# Patient Record
Sex: Female | Born: 1985 | Hispanic: Yes | Marital: Married | Smoking: Never smoker
Health system: Southern US, Community
[De-identification: ages and names within clinical notes are randomized; demographics above are authoritative.]

## PROBLEM LIST (undated history)

## (undated) DIAGNOSIS — Z789 Other specified health status: Secondary | ICD-10-CM

## (undated) HISTORY — PX: NO PAST SURGERIES: SHX2092

---
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
## 2006-01-13 ENCOUNTER — Ambulatory Visit (HOSPITAL_COMMUNITY): Admission: RE | Admit: 2006-01-13 | Discharge: 2006-01-13 | Payer: Self-pay | Admitting: Obstetrics & Gynecology

## 2006-06-17 ENCOUNTER — Inpatient Hospital Stay (HOSPITAL_COMMUNITY): Admission: AD | Admit: 2006-06-17 | Discharge: 2006-06-18 | Payer: Self-pay | Admitting: Gynecology

## 2006-06-17 ENCOUNTER — Ambulatory Visit: Payer: Self-pay | Admitting: Obstetrics & Gynecology

## 2007-01-17 IMAGING — US US OB COMP +14 WK
1 series · 13 of 28 positions shown · non-contrast
Comparison: none

CLINICAL DATA: 18 week 6 day gestational age by LMP.  Obesity.  Evaluate dating and anatomy.

[Series 1: us ob comp +14 wk · 0.29mm/px · 13 of 93 slices shown]
[im 4/93]
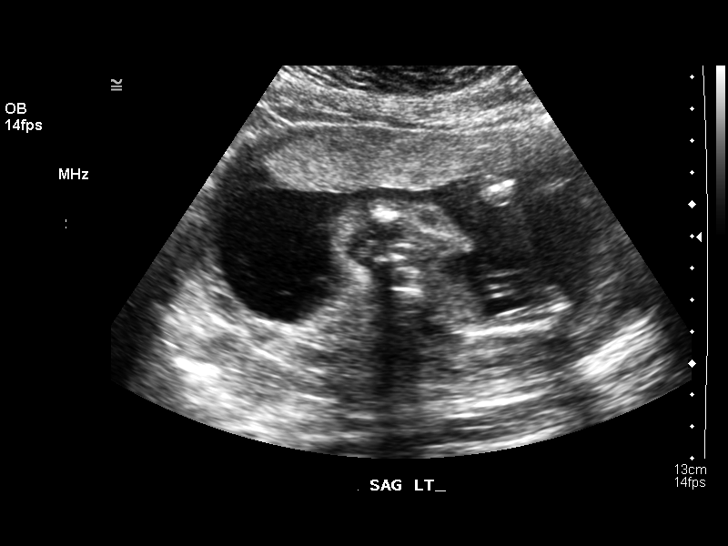
[im 11/93]
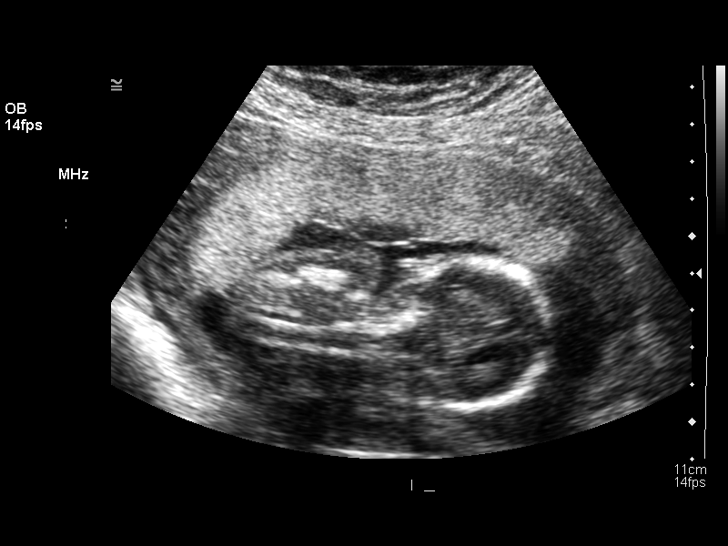
[im 18/93]
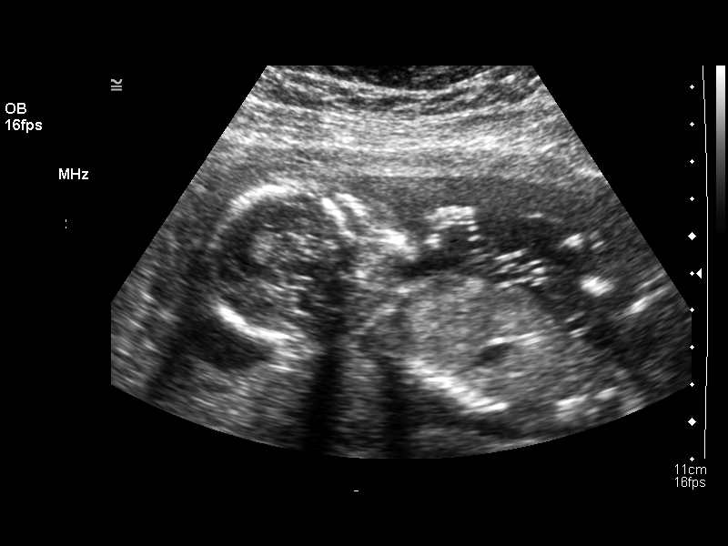
[im 24/93]
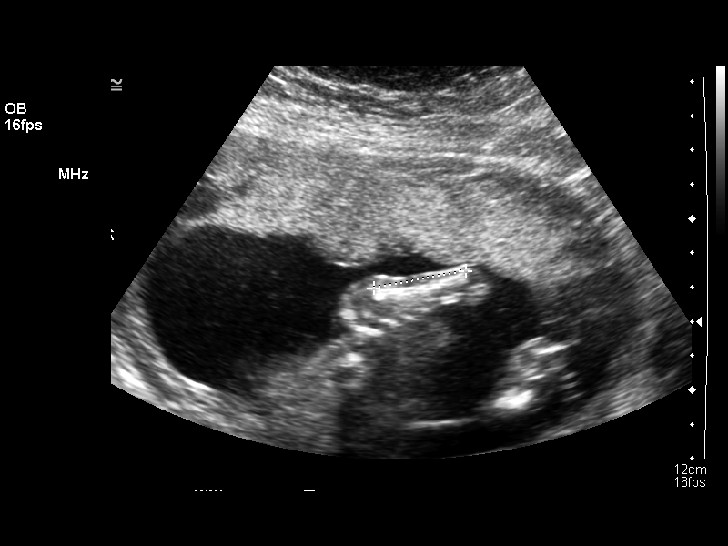
[im 31/93]
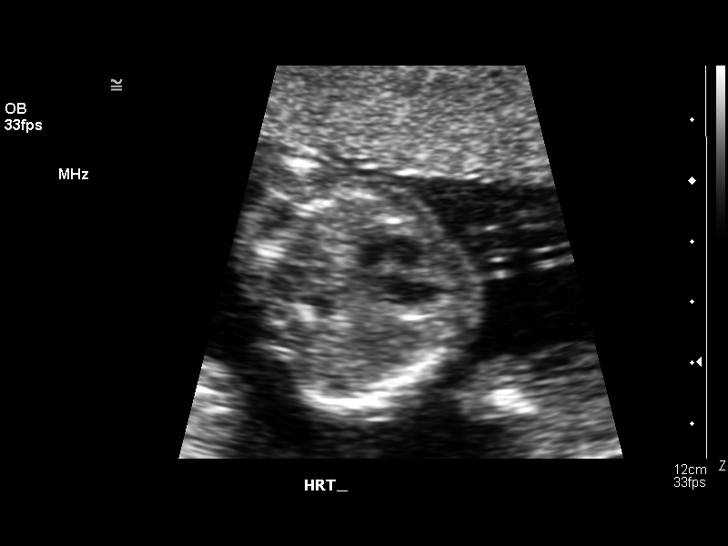
[im 38/93]
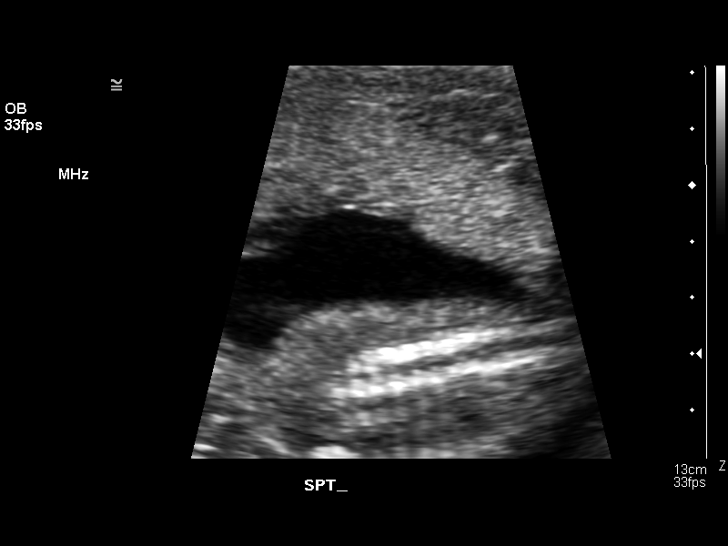
[im 48/93]
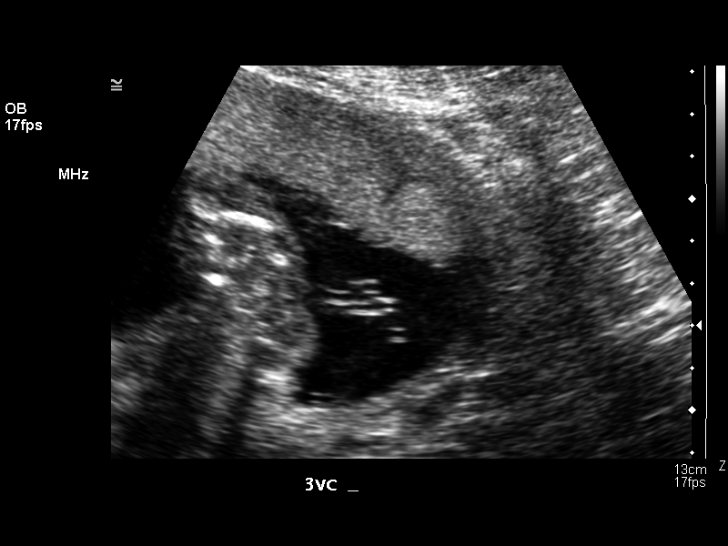
[im 55/93]
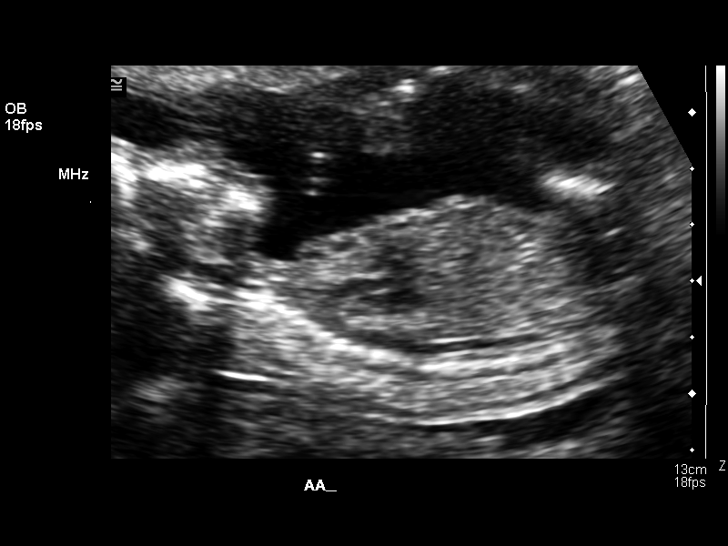
[im 62/93]
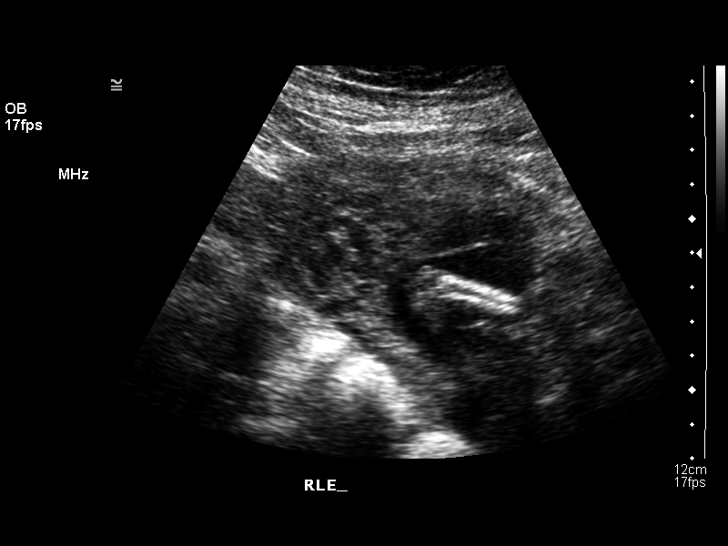
[im 69/93]
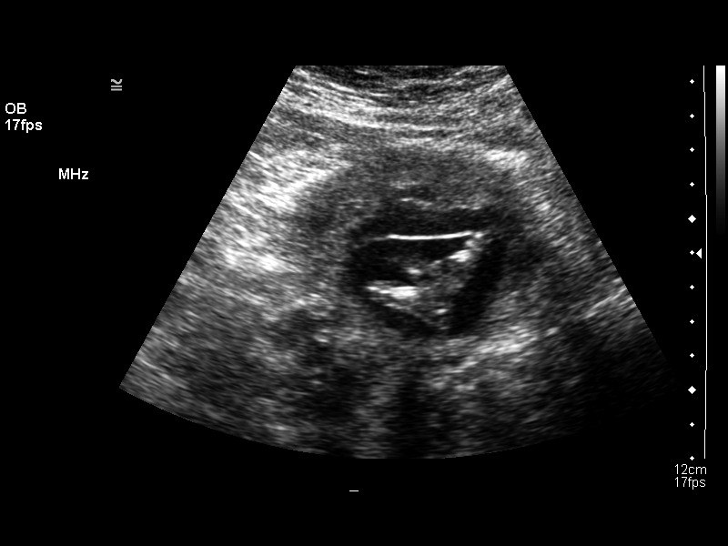
[im 75/93]
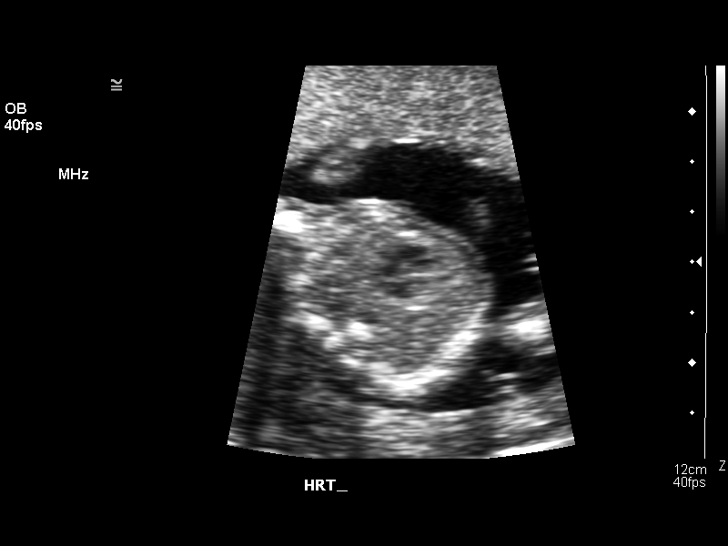
[im 82/93]
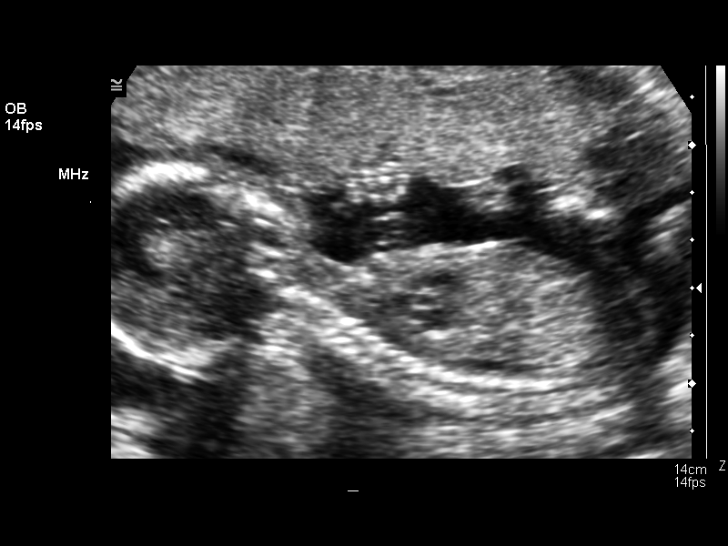
[im 89/93]
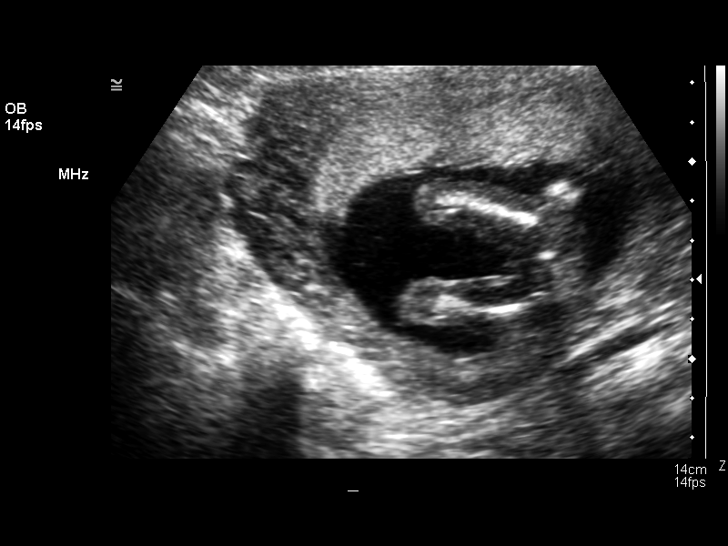

[13 of 28 positions shown; findings below may reference images not displayed]

OBSTETRICAL ULTRASOUND:
 Number of Fetuses: 1
 Heart Rate: 150 bpm
 Movement:  Yes
 Breathing:  Yes  
 Presentation:  Breech
 Placental Location:  Anterior
 Grade:  I
 Previa:  No
 Amniotic Fluid (Subjective):  Normal
 Amniotic Fluid (Objective):   4.2 cm vertical pocket 

 FETAL BIOMETRY
 BPD:   4.0 cm  18 w 2 d
 HC:   15.1 cm  18 w 1 d
 AC:  12.8 cm  18 w 3 d
 FL:  2.7 cm   18 w 3 d
 HL:    2.6 cm  18 w 4 d  
 MEAN GA:  18 w 3 d   US EDC:  06/13/06

 FETAL ANATOMY
 Lateral Ventricles:  Visualized  
 Thalami/CSP:  Visualized  
 Posterior Fossa:  Visualized   
 Nuchal Region:  Visualized 
 Spine:  Visualized  
 4 Chamber Heart on Left:  Visualized  
 Stomach on Left:  Visualized  
 3 Vessel Cord:  Visualized 
 Cord Insertion site:  Visualized 
 Kidneys:  Visualized   
 Bladder:  Visualized   
 Extremities:  Visualized  

 ADDITIONAL ANATOMY VISUALIZED: LVOT, RVOT, upper lip, orbits, profile, diaphragm, heel, 5th digit, ductal arch, and aortic arch.

 MATERNAL UTERINE AND ADNEXAL FINDINGS
 Cervix:   4.0 cm transabdominal.
 Right ovary is unremarkable.  Left ovary not visualized; however, no adnexal mass identified.
IMPRESSION: 1.  Single living intrauterine fetus with mean gestational age of 18 weeks 3 days and sonographic EDC of 06/13/06.  This is concordant with LMP.
 2.  No evidence of fetal anatomic abnormality.

## 2010-06-13 ENCOUNTER — Ambulatory Visit (HOSPITAL_COMMUNITY)
Admission: RE | Admit: 2010-06-13 | Discharge: 2010-06-13 | Payer: Self-pay | Source: Home / Self Care | Attending: Family Medicine | Admitting: Family Medicine

## 2010-08-30 ENCOUNTER — Inpatient Hospital Stay (HOSPITAL_COMMUNITY): Admission: AD | Admit: 2010-08-30 | Payer: Self-pay | Source: Home / Self Care | Admitting: Obstetrics & Gynecology

## 2010-09-01 ENCOUNTER — Other Ambulatory Visit: Payer: Self-pay | Admitting: Family Medicine

## 2010-09-01 DIAGNOSIS — O480 Post-term pregnancy: Secondary | ICD-10-CM

## 2010-09-02 ENCOUNTER — Inpatient Hospital Stay (HOSPITAL_COMMUNITY): Admission: RE | Admit: 2010-09-02 | Payer: Self-pay | Source: Ambulatory Visit

## 2010-09-02 ENCOUNTER — Inpatient Hospital Stay (HOSPITAL_COMMUNITY)
Admission: AD | Admit: 2010-09-02 | Discharge: 2010-09-04 | DRG: 775 | Disposition: A | Discharge status: Home / Self Care | Payer: Medicaid Other | Source: Ambulatory Visit | Attending: Obstetrics and Gynecology | Admitting: Obstetrics and Gynecology

## 2010-09-02 ENCOUNTER — Ambulatory Visit (HOSPITAL_COMMUNITY)
Admission: RE | Admit: 2010-09-02 | Discharge: 2010-09-02 | Payer: Medicaid Other | Source: Ambulatory Visit | Attending: Family Medicine | Admitting: Family Medicine

## 2010-09-02 LAB — CBC
HCT: 37.7 % (ref 36.0–46.0)
Hemoglobin: 12.2 g/dL (ref 12.0–15.0)
MCH: 26 pg (ref 26.0–34.0)
MCHC: 32.4 g/dL (ref 30.0–36.0)
MCV: 80.2 fL (ref 78.0–100.0)
Platelets: 174 10*3/uL (ref 150–400)
RBC: 4.7 MIL/uL (ref 3.87–5.11)
RDW: 14.8 % (ref 11.5–15.5)
WBC: 9.8 10*3/uL (ref 4.0–10.5)

## 2010-09-02 LAB — RPR: RPR Ser Ql: NONREACTIVE

## 2010-09-09 ENCOUNTER — Other Ambulatory Visit: Payer: Self-pay

## 2010-10-10 NOTE — Consult Note (Signed)
   Amy Velazquez, Amy Velazquez                          ACCOUNT NO.:  [id number]   MEDICAL RECORD NO.:  [id number]                   PATIENT TYPE:   LOCATION:                                       FACILITY:   PHYSICIAN:  Dr. Kristen Loader                    DATE OF BIRTH:   DATE OF CONSULTATION:  DATE OF DISCHARGE:                                   CONSULTATION   SUMMARY:  After further discussion with Dr. Gavin Potters, we will cancel the  patient's discharge at this time.     Obstetrics Resident                       Dr. Kristen Loader    OR/MEDQ  D:  08/22/2002  T:  08/22/2002  Job:  [id number]   cc:   Conni Elliot, M.D.  [address]  [place]  [place] [address]  Fax: [phone]

## 2010-10-10 NOTE — Discharge Summary (Signed)
   NAMEDEVANI, Amy Velazquez                          ACCOUNT NO.:  [id number]   MEDICAL RECORD NO.:  [id number]                   PATIENT TYPE:   LOCATION:                                       FACILITY:   PHYSICIAN:  Dr. Kristen Loader                    DATE OF BIRTH:   DATE OF ADMISSION:  DATE OF DISCHARGE:                                 DISCHARGE SUMMARY   DISCHARGE DIAGNOSES:  1. Group B strep bacteruria.  2. Threatened preterm labor.  3. Intrauterine pregnancy at [redacted] weeks gestation.   CONSULTATIONS:  None.   LABORATORY DATA:  CBC revealed WBC count 13.2, hemoglobin and hematocrit  10.4 and 30.4, RPR non-reactive. Please see admission history and physical  for further laboratories.   HISTORY OF PRESENT ILLNESS:  The patient is an 25 year old Gravida I, Para  0, Hispanic female who presented at 22 and [redacted] weeks gestation by last  menstrual period, confirmed on ultrasound, who was sent over from the clinic  with GBS bacteruria and cervical change. The patient was admitted overnight  and placed on Unasyn 3 grams IV every six hours, Betamethasone 12.5 mg every  24 hours times two, and placed on continuous monitoring. The patient had  very mild uterine irritability but no sustained contractions. Fetal heart  rate remained in the 135 to 160 range and was reactive with long term  variability with no decelerations throughout her hospital stay. Cervical  recheck on discharge finds the patient to be fingertip, 60% effaced, with  high station. She had previous been noted at 1 cm, 40% effaced and posterior  at clinic on the 29th.   PLANS FOR DISCHARGE:  The patient is to be discharged to home today.   ACTIVITY:  Nothing per vagina until further notice.   DIET:  Regular.   DISCHARGE MEDICATIONS:  Prenatal vitamins.   FOLLOW UP:  The patient is to be discharged to follow-up at [facility] on Wednesday, August 30, 2002 at 8:30 a.m.      Obstetrics Resident                        Dr. Kristen Loader    OR/MEDQ  D:  08/22/2002  T:  08/22/2002  Job:  [id number]

## 2011-05-14 ENCOUNTER — Encounter: Payer: Self-pay | Admitting: Emergency Medicine

## 2011-05-14 ENCOUNTER — Emergency Department (HOSPITAL_COMMUNITY)
Admission: EM | Admit: 2011-05-14 | Discharge: 2011-05-14 | Disposition: A | Discharge status: Home / Self Care | Payer: Medicaid Other | Source: Home / Self Care | Attending: Emergency Medicine | Admitting: Emergency Medicine

## 2011-05-14 DIAGNOSIS — K2970 Gastritis, unspecified, without bleeding: Secondary | ICD-10-CM

## 2011-05-14 DIAGNOSIS — K2900 Acute gastritis without bleeding: Secondary | ICD-10-CM

## 2011-05-14 LAB — POCT URINALYSIS DIP (DEVICE)
Leukocytes, UA: NEGATIVE
Nitrite: NEGATIVE
Protein, ur: NEGATIVE mg/dL
Urobilinogen, UA: 1 mg/dL (ref 0.0–1.0)
pH: 5.5 (ref 5.0–8.0)

## 2011-05-14 LAB — POCT H PYLORI SCREEN: H. PYLORI SCREEN, POC: NEGATIVE

## 2011-05-14 LAB — POCT PREGNANCY, URINE: Preg Test, Ur: NEGATIVE

## 2011-05-14 MED ORDER — SUCRALFATE 1 GM/10ML PO SUSP: 1.0000 g | Freq: Four times a day (QID) | ORAL | Status: AC

## 2011-05-14 MED ORDER — PANTOPRAZOLE SODIUM 40 MG PO TBEC: 40.0000 mg | DELAYED_RELEASE_TABLET | Freq: Every day | ORAL | Status: DC

## 2011-05-14 MED ORDER — FAMOTIDINE 20 MG PO TABS: 20.0000 mg | ORAL_TABLET | Freq: Every day | ORAL | Status: DC

## 2011-05-14 MED ORDER — SUCRALFATE 1 GM/10ML PO SUSP: 1.0000 g | Freq: Four times a day (QID) | ORAL | Status: DC

## 2011-05-14 NOTE — ED Provider Notes (Signed)
History     CSN: [id number]  Arrival date & time 05/14/11  7846   First MD Initiated Contact with Patient 05/14/11 1831      Chief Complaint  Patient presents with  . Abdominal Pain    HPI Comments: burning intermittent LUQ pain starting last night. Started in RUQ last night and is now in LUQ. Lasts approx 20 min and resolves. Has nausea, but no anorexia. Is afraid to eat because afraid it will make it worse. Reports abd bloating but no distension. Vomited stomach contents 1x this am but no change in pain. No fevers, CP, cough, wheeze, SOB, urinary c/o, vaginal c/o. BM this am was WNL.  Pain worse with palpation. No alleviating factors. Similar sx before 2 months ago and was told she had a "stomach infection" which was treated with amoxicillin and an unknown injection.   Patient is a 25 y.o. female presenting with abdominal pain. The history is provided by the patient. The history is limited by a language barrier. A language interpreter was used.  Abdominal Pain The primary symptoms of the illness include abdominal pain, nausea and vomiting. The primary symptoms of the illness do not include fever, shortness of breath, diarrhea, dysuria, vaginal discharge or vaginal bleeding.  Symptoms associated with the illness do not include constipation, urgency, hematuria, frequency or back pain.    History reviewed. No pertinent past medical history.  History reviewed. No pertinent past surgical history.  History reviewed. No pertinent family history.  History  Substance Use Topics  . Smoking status: Never Smoker   . Smokeless tobacco: Not on file  . Alcohol Use: No    OB History    Grav Para Term Preterm Abortions TAB SAB Ect Mult Living                  Review of Systems  Constitutional: Negative for fever.  Respiratory: Negative for cough, chest tightness and shortness of breath.   Cardiovascular: Negative for chest pain.  Gastrointestinal: Positive for nausea, vomiting and  abdominal pain. Negative for diarrhea, constipation, blood in stool, abdominal distention and rectal pain.  Genitourinary: Negative for dysuria, urgency, frequency, hematuria, flank pain, vaginal bleeding, vaginal discharge and pelvic pain.  Musculoskeletal: Negative for back pain.    Allergies  Review of patient's allergies indicates no known allergies.  Home Medications   Current Outpatient Rx  Name Route Sig Dispense Refill  . FAMOTIDINE 20 MG PO TABS Oral Take 1 tablet (20 mg total) by mouth daily. 20 tablet 0  . PANTOPRAZOLE SODIUM 40 MG PO TBEC Oral Take 1 tablet (40 mg total) by mouth daily. 20 tablet 0  . SUCRALFATE 1 GM/10ML PO SUSP Oral Take 10 mLs (1 g total) by mouth 4 (four) times daily. 10 mL before meals and before bedtime 240 mL 0    BP 121/80  Pulse 77  Temp(Src) 97.7 F (36.5 C) (Oral)  Resp 18  SpO2 97%  LMP 05/11/2011  Physical Exam  Nursing note and vitals reviewed. Constitutional: She is oriented to person, place, and time. She appears well-developed and well-nourished.  HENT:  Head: Normocephalic and atraumatic.  Eyes: Conjunctivae and EOM are normal. Pupils are equal, round, and reactive to light.  Neck: Normal range of motion.  Cardiovascular: Normal rate, regular rhythm, normal heart sounds and intact distal pulses.   No murmur heard. Pulmonary/Chest: Effort normal and breath sounds normal.  Abdominal: Soft. Bowel sounds are normal. She exhibits no distension. There is no hepatosplenomegaly. There  is tenderness in the left upper quadrant. There is no rigidity, no rebound, no guarding, no CVA tenderness, no tenderness at McBurney's point and negative Murphy's sign.       Mild LUQ tenderness.   Musculoskeletal: Normal range of motion. She exhibits no edema and no tenderness.  Neurological: She is alert and oriented to person, place, and time.  Skin: Skin is warm and dry.  Psychiatric: She has a normal mood and affect. Her behavior is normal. Judgment  and thought content normal.    ED Course  Procedures (including critical care time)  Labs Reviewed  POCT URINALYSIS DIP (DEVICE) - Abnormal; Notable for the following:    Bilirubin Urine MODERATE (*)    All other components within normal limits  POCT H PYLORI SCREEN  POCT PREGNANCY, URINE  POCT URINALYSIS DIPSTICK  POCT PREGNANCY, URINE   No results found.   1. Gastritis     Results for orders placed during the hospital encounter of 05/14/11  POCT H PYLORI SCREEN      Component Value Range   H. PYLORI SCREEN, POC NEGATIVE  NEGATIVE   POCT URINALYSIS DIP (DEVICE)      Component Value Range   Glucose, UA NEGATIVE  NEGATIVE (mg/dL)   Bilirubin Urine MODERATE (*) NEGATIVE    Ketones, ur NEGATIVE  NEGATIVE (mg/dL)   Specific Gravity, Urine 1.010  1.005 - 1.030    Hgb urine dipstick NEGATIVE  NEGATIVE    pH 5.5  5.0 - 8.0    Protein, ur NEGATIVE  NEGATIVE (mg/dL)   Urobilinogen, UA 1.0  0.0 - 1.0 (mg/dL)   Nitrite NEGATIVE  NEGATIVE    Leukocytes, UA NEGATIVE  NEGATIVE   POCT PREGNANCY, URINE      Component Value Range   Preg Test, Ur NEGATIVE       MDM  Pt abd exam is benign, no peritoneal signs. No evidence of surgical abd. Doubt SBO, mesenteric ischemia, appendicitis, hepatitis, cholecystitis, pancreatitis, or perforated viscus. No evidence to support or suggest GYN pathology such as ovarian torsion or infection. No RUQ tenderness suggesting cholelithiasis. Hx suggestive of h pylori but rapid test neg. Will try H2/ppi/sucralfate and if no better in several days will have her return and start tx for h. Pylori at that time.    Luiz Blare, MD 05/15/11 Rich Fuchs

## 2011-05-14 NOTE — ED Notes (Signed)
Pt c/o upper abdominal pain starting last night. She says it is not cramping, it appears to be right below her breast and midline. No urinary frequency or urgency

## 2014-05-25 NOTE — L&D Delivery Note (Signed)
Patient is 29 y.o. [id number] 4261w3d admitted in active labor and progressed quickly to complete with the strong urge to push. She received 1 dose of IV fentanyl ~30 minutes prior to delivery.    Delivery Note At 9:20 PM a viable female was delivered via Vaginal, Spontaneous Delivery (Presentation: Left Occiput Anterior).  APGAR: 9, 10; weight-pending.   Placenta status:Spontaneous, Intact.  Cord: 3 vessels. No complications None.  Cord pH: not collected  Anesthesia: None  Episiotomy: None Lacerations: None Suture Repair: n/a Est. Blood Loss (mL): 256  Mom to postpartum.  Baby to Couplet care / Skin to Skin.  Isa RankinKimberly Niles [facility] 03/30/2015, 9:43 PM

## 2014-10-25 ENCOUNTER — Other Ambulatory Visit (HOSPITAL_COMMUNITY): Payer: Self-pay | Admitting: Physician Assistant

## 2014-10-25 DIAGNOSIS — IMO0002 Reserved for concepts with insufficient information to code with codable children: Secondary | ICD-10-CM

## 2014-10-25 DIAGNOSIS — Z0489 Encounter for examination and observation for other specified reasons: Secondary | ICD-10-CM

## 2014-10-25 LAB — OB RESULTS CONSOLE HEPATITIS B SURFACE ANTIGEN: Hepatitis B Surface Ag: NEGATIVE

## 2014-10-25 LAB — OB RESULTS CONSOLE RUBELLA ANTIBODY, IGM: Rubella: IMMUNE

## 2014-10-25 LAB — OB RESULTS CONSOLE RPR: RPR: NONREACTIVE

## 2014-10-25 LAB — OB RESULTS CONSOLE GC/CHLAMYDIA
Chlamydia: NEGATIVE
GC PROBE AMP, GENITAL: NEGATIVE

## 2014-10-25 LAB — OB RESULTS CONSOLE ANTIBODY SCREEN: ANTIBODY SCREEN: NEGATIVE

## 2014-10-25 LAB — OB RESULTS CONSOLE ABO/RH: RH Type: POSITIVE

## 2014-10-25 LAB — OB RESULTS CONSOLE HIV ANTIBODY (ROUTINE TESTING): HIV: NONREACTIVE

## 2014-11-19 ENCOUNTER — Ambulatory Visit (HOSPITAL_COMMUNITY)
Admission: RE | Admit: 2014-11-19 | Discharge: 2014-11-19 | Disposition: A | Discharge status: Home / Self Care | Payer: Medicaid Other | Source: Ambulatory Visit | Attending: Physician Assistant | Admitting: Physician Assistant

## 2014-11-19 DIAGNOSIS — Z3A20 20 weeks gestation of pregnancy: Secondary | ICD-10-CM | POA: Insufficient documentation

## 2014-11-19 DIAGNOSIS — Z0489 Encounter for examination and observation for other specified reasons: Secondary | ICD-10-CM

## 2014-11-19 DIAGNOSIS — Z36 Encounter for antenatal screening of mother: Secondary | ICD-10-CM | POA: Insufficient documentation

## 2014-11-19 DIAGNOSIS — Z3689 Encounter for other specified antenatal screening: Secondary | ICD-10-CM | POA: Insufficient documentation

## 2014-11-19 DIAGNOSIS — IMO0002 Reserved for concepts with insufficient information to code with codable children: Secondary | ICD-10-CM

## 2014-12-10 ENCOUNTER — Other Ambulatory Visit (HOSPITAL_COMMUNITY): Payer: Self-pay | Admitting: Physician Assistant

## 2014-12-10 DIAGNOSIS — Z3A24 24 weeks gestation of pregnancy: Secondary | ICD-10-CM

## 2014-12-10 DIAGNOSIS — Z0489 Encounter for examination and observation for other specified reasons: Secondary | ICD-10-CM

## 2014-12-10 DIAGNOSIS — IMO0002 Reserved for concepts with insufficient information to code with codable children: Secondary | ICD-10-CM

## 2014-12-13 ENCOUNTER — Ambulatory Visit (HOSPITAL_COMMUNITY)
Admission: RE | Admit: 2014-12-13 | Discharge: 2014-12-13 | Disposition: A | Discharge status: Home / Self Care | Payer: Medicaid Other | Source: Ambulatory Visit | Attending: Physician Assistant | Admitting: Physician Assistant

## 2014-12-13 DIAGNOSIS — Z36 Encounter for antenatal screening of mother: Secondary | ICD-10-CM | POA: Diagnosis present

## 2014-12-13 DIAGNOSIS — Z3A24 24 weeks gestation of pregnancy: Secondary | ICD-10-CM | POA: Diagnosis not present

## 2014-12-13 DIAGNOSIS — Z0489 Encounter for examination and observation for other specified reasons: Secondary | ICD-10-CM | POA: Insufficient documentation

## 2014-12-13 DIAGNOSIS — IMO0002 Reserved for concepts with insufficient information to code with codable children: Secondary | ICD-10-CM | POA: Insufficient documentation

## 2015-03-18 LAB — OB RESULTS CONSOLE GBS: STREP GROUP B AG: POSITIVE

## 2015-03-30 ENCOUNTER — Encounter (HOSPITAL_COMMUNITY): Payer: Self-pay | Admitting: *Deleted

## 2015-03-30 ENCOUNTER — Inpatient Hospital Stay (HOSPITAL_COMMUNITY)
Admission: AD | Admit: 2015-03-30 | Discharge: 2015-04-01 | DRG: 775 | Disposition: A | Discharge status: Home / Self Care | Payer: Medicaid Other | Source: Ambulatory Visit | Attending: Obstetrics and Gynecology | Admitting: Obstetrics and Gynecology

## 2015-03-30 DIAGNOSIS — IMO0001 Reserved for inherently not codable concepts without codable children: Secondary | ICD-10-CM

## 2015-03-30 DIAGNOSIS — O99824 Streptococcus B carrier state complicating childbirth: Principal | ICD-10-CM | POA: Diagnosis present

## 2015-03-30 DIAGNOSIS — Z3A38 38 weeks gestation of pregnancy: Secondary | ICD-10-CM

## 2015-03-30 HISTORY — DX: Other specified health status: Z78.9

## 2015-03-30 LAB — CBC
HCT: 35.7 % — ABNORMAL LOW (ref 36.0–46.0)
HEMOGLOBIN: 11.7 g/dL — AB (ref 12.0–15.0)
MCH: 25.7 pg — AB (ref 26.0–34.0)
MCHC: 32.8 g/dL (ref 30.0–36.0)
MCV: 78.5 fL (ref 78.0–100.0)
PLATELETS: 196 10*3/uL (ref 150–400)
RBC: 4.55 MIL/uL (ref 3.87–5.11)
RDW: 15.1 % (ref 11.5–15.5)
WBC: 12.1 10*3/uL — AB (ref 4.0–10.5)

## 2015-03-30 LAB — TYPE AND SCREEN
ABO/RH(D): AB POS
Antibody Screen: NEGATIVE

## 2015-03-30 MED ORDER — OXYCODONE-ACETAMINOPHEN 5-325 MG PO TABS: 1.0000 | ORAL_TABLET | ORAL | Status: DC | PRN

## 2015-03-30 MED ORDER — CITRIC ACID-SODIUM CITRATE 334-500 MG/5ML PO SOLN: 30.0000 mL | ORAL | Status: DC | PRN

## 2015-03-30 MED ORDER — DIPHENHYDRAMINE HCL 25 MG PO CAPS: 25.0000 mg | ORAL_CAPSULE | Freq: Four times a day (QID) | ORAL | Status: DC | PRN

## 2015-03-30 MED ORDER — WITCH HAZEL-GLYCERIN EX PADS: 1.0000 "application " | MEDICATED_PAD | CUTANEOUS | Status: DC | PRN

## 2015-03-30 MED ORDER — BENZOCAINE-MENTHOL 20-0.5 % EX AERO: 1.0000 "application " | INHALATION_SPRAY | CUTANEOUS | Status: DC | PRN

## 2015-03-30 MED ORDER — OXYTOCIN BOLUS FROM INFUSION: 500.0000 mL | INTRAVENOUS | Status: DC

## 2015-03-30 MED ORDER — LIDOCAINE HCL (PF) 1 % IJ SOLN
30.0000 mL | INTRAMUSCULAR | Status: DC | PRN
  Filled 2015-03-30: qty 30

## 2015-03-30 MED ORDER — OXYCODONE-ACETAMINOPHEN 5-325 MG PO TABS: 2.0000 | ORAL_TABLET | ORAL | Status: DC | PRN

## 2015-03-30 MED ORDER — DIBUCAINE 1 % RE OINT: 1.0000 "application " | TOPICAL_OINTMENT | RECTAL | Status: DC | PRN

## 2015-03-30 MED ORDER — IBUPROFEN 600 MG PO TABS
600.0000 mg | ORAL_TABLET | Freq: Four times a day (QID) | ORAL | Status: DC
  Administered 2015-03-30 – 2015-04-01 (×8): 600 mg via ORAL
  Filled 2015-03-30 (×8): qty 1

## 2015-03-30 MED ORDER — ONDANSETRON HCL 4 MG PO TABS: 4.0000 mg | ORAL_TABLET | ORAL | Status: DC | PRN

## 2015-03-30 MED ORDER — ACETAMINOPHEN 325 MG PO TABS: 650.0000 mg | ORAL_TABLET | ORAL | Status: DC | PRN

## 2015-03-30 MED ORDER — FENTANYL CITRATE (PF) 100 MCG/2ML IJ SOLN
100.0000 ug | INTRAMUSCULAR | Status: DC | PRN
  Administered 2015-03-30: 100 ug via INTRAVENOUS
  Filled 2015-03-30: qty 2

## 2015-03-30 MED ORDER — LACTATED RINGERS IV SOLN: INTRAVENOUS | Status: DC

## 2015-03-30 MED ORDER — PRENATAL MULTIVITAMIN CH
1.0000 | ORAL_TABLET | Freq: Every day | ORAL | Status: DC
  Administered 2015-03-31 – 2015-04-01 (×2): 1 via ORAL
  Filled 2015-03-30 (×2): qty 1

## 2015-03-30 MED ORDER — SIMETHICONE 80 MG PO CHEW: 80.0000 mg | CHEWABLE_TABLET | ORAL | Status: DC | PRN

## 2015-03-30 MED ORDER — SODIUM CHLORIDE 0.9 % IV SOLN
2.0000 g | Freq: Once | INTRAVENOUS | Status: AC
  Administered 2015-03-30: 2 g via INTRAVENOUS
  Filled 2015-03-30: qty 2000

## 2015-03-30 MED ORDER — LANOLIN HYDROUS EX OINT: TOPICAL_OINTMENT | CUTANEOUS | Status: DC | PRN

## 2015-03-30 MED ORDER — ONDANSETRON HCL 4 MG/2ML IJ SOLN: 4.0000 mg | Freq: Four times a day (QID) | INTRAMUSCULAR | Status: DC | PRN

## 2015-03-30 MED ORDER — LACTATED RINGERS IV SOLN: 500.0000 mL | INTRAVENOUS | Status: DC | PRN

## 2015-03-30 MED ORDER — OXYTOCIN 40 UNITS IN LACTATED RINGERS INFUSION - SIMPLE MED
62.5000 mL/h | INTRAVENOUS | Status: DC
  Filled 2015-03-30: qty 1000

## 2015-03-30 MED ORDER — DOCUSATE SODIUM 100 MG PO CAPS
100.0000 mg | ORAL_CAPSULE | Freq: Two times a day (BID) | ORAL | Status: DC
  Administered 2015-03-31 – 2015-04-01 (×3): 100 mg via ORAL
  Filled 2015-03-30 (×3): qty 1

## 2015-03-30 MED ORDER — ONDANSETRON HCL 4 MG/2ML IJ SOLN: 4.0000 mg | INTRAMUSCULAR | Status: DC | PRN

## 2015-03-30 MED ORDER — OXYTOCIN 40 UNITS IN LACTATED RINGERS INFUSION - SIMPLE MED: 62.5000 mL/h | INTRAVENOUS | Status: DC | PRN

## 2015-03-30 NOTE — H&P (Signed)
OBSTETRIC ADMISSION HISTORY AND PHYSICAL  Amy Velazquez is a 29 y.o. female [phone] with IUP at [redacted]w[redacted]d by L/20 presenting for SROM at 1830 and contractions. She reports +FMs, No LOF, no VB, no blurry vision, headaches or peripheral edema, and RUQ pain.  She plans on Breast feeding. She requests OCPs for birth control.  Dating: By LMP and 20 wk US--->  Estimated Date of Delivery: 04/10/15  Sono:    , CWD, normal anatomy, cephalic presentation, longitudinal lie, 377 g, 43 % EFW   Prenatal History/Complications:  Past Medical History: Past Medical History  Diagnosis Date  . Medical history non-contributory     Past Surgical History: Past Surgical History  Procedure Laterality Date  . No past surgeries      Obstetrical History: OB History    Gravida Para Term Preterm AB TAB SAB Ectopic Multiple Living   Social History: Social History   Social History  . Marital Status: Significant Other    Spouse Name: N/A  . Number of Children: N/A  . Years of Education: N/A   Social History Main Topics  . Smoking status: Never Smoker   . Smokeless tobacco: None  . Alcohol Use: No  . Drug Use: No  . Sexual Activity: No   Other Topics Concern  . None   Social History Narrative    Family History: History reviewed. No pertinent family history.  Allergies: No Known Allergies  Prescriptions prior to admission  Medication Sig Dispense Refill Last Dose  . Prenatal Vit-Fe Fumarate-FA (PRENATAL MULTIVITAMIN) TABS tablet Take 1 tablet by mouth at bedtime.   03/30/2015 at Unknown time     Review of Systems   All systems reviewed and negative except as stated in HPI  Blood pressure 148/86, pulse 76, temperature 98 F (36.7 C), temperature source Oral, resp. rate 18. General appearance: alert, cooperative and mild distress Lungs: clear to auscultation bilaterally Heart: regular rate and rhythm Abdomen: soft, non-tender; bowel sounds  normal Pelvic: Cervix dilated to 7 cm Extremities: Homans sign is negative, no sign of DVT Presentation: cephalic Fetal monitoringBaseline: 120 bpm, Variability: Good {> 6 bpm), Accelerations: Reactive and Decelerations: Absent Uterine activity Frequency: Every 2 minutes, strong in intensity Dilation: 10 Effacement (%): 100 Station: +3 Exam by:: C.Okoroji RN-BSN   Prenatal labs: ABO, Rh: --/--/AB POS (11/05 2010) Antibody: NEG (11/05 2010) Rubella: IMMUNE RPR: Nonreactive (06/02 0000)  HBsAg: Negative (06/02 0000)  HIV: Non-reactive (06/02 0000)  GBS: Positive (10/24 0000)  1 hr Glucola - unk - not in scanned records Genetic screening  Quad wnl Anatomy US wnl  Prenatal Transfer Tool  Maternal Diabetes: No Genetic Screening: Normal Maternal Ultrasounds/Referrals: Normal Fetal Ultrasounds or other Referrals:  None Maternal Substance Abuse:  No Significant Maternal Medications:  None Significant Maternal Lab Results: Lab values include: Group B Strep positive  Results for orders placed or performed during the hospital encounter of 03/30/15 (from the past 24 hour(s))  CBC   Collection Time: 03/30/15  8:10 PM  Result Value Ref Range   WBC 12.1 (H) 4.0 - 10.5 K/uL   RBC 4.55 3.87 - 5.11 MIL/uL   Hemoglobin 11.7 (L) 12.0 - 15.0 g/dL   HCT 86.5 (L) 78.4 - 69.6 %   MCV 78.5 78.0 - 100.0 fL   MCH 25.7 (L) 26.0 - 34.0 pg   MCHC 32.8 30.0 - 36.0 g/dL   RDW 29.5 28.4 - 13.2 %  Platelets 196 150 - 400 K/uL  Type and screen [facility]   Collection Time: 03/30/15  8:10 PM  Result Value Ref Range   ABO/RH(D) AB POS    Antibody Screen NEG    Sample Expiration 04/02/2015     Patient Active Problem List   Diagnosis Date Noted  . Active labor 03/30/2015  . [redacted] weeks gestation of pregnancy   . Evaluate anatomy not seen on prior sonogram   . Encounter for fetal anatomic survey   . [redacted] weeks gestation of pregnancy     Assessment: Amy Velazquez is a  29 y.o. G4P3003 at 7219w3d here for SROM in active labor.   #Labor: Expectant management #Pain: IV Fentanyl, does not want epidural #FWB: Cat I #ID:  GBS pos- Ampicillin due to advanced labor #MOF: Breast #MOC: OCPs #Circ:  No  Dani GobbleHillary Fitzgerald, MD [facility], PGY-1  OB fellow attestation: I have seen and examined this patient; I agree with above documentation in the resident's note.   Amy Velazquez is a 29 y.o. [phone] here for active labor at term with SROM  PE: BP 148/86 mmHg  Pulse 76  Temp(Src) 98 F (36.7 C) (Oral)  Resp 18 Gen: calm comfortable, NAD Resp: normal effort, no distress Abd: gravid  ROS, labs, PMH reviewed  Plan: Admit to LD Expectant management GBS pos- Ampicillin  Federico FlakeKimberly Niles Rifky Lapre, MD Family Medicine, OB Fellow 03/30/2015, 9:51 PM

## 2015-03-30 NOTE — MAU Note (Signed)
Pt reports her water broke at 615 tonight. Clear fluid. Having ctx as well. Good fetal movement reported.

## 2015-03-31 LAB — ABO/RH: ABO/RH(D): AB POS

## 2015-03-31 NOTE — Progress Notes (Signed)
Assisted RN and midwife with interpretation of patient assessment.  Spanish Interpreter

## 2015-03-31 NOTE — Lactation Note (Signed)
This note was copied from the chart of Boy Josephene Sem. Lactation Consultation Note  Patient Name: Boy Lamont DowdyMaribel Launer QIONG'EToday's Date: 03/31/2015 Reason for consult: Initial assessment   Initial consult on experienced BF mom of 18 hour old infant. Mom reports that she BF her first 3 infants for 6 months each. She reports to me that she does not have any milk and wants to give the baby bottles, she reports that she has breast and bottle fed all her others. Infant has 5 Bf for 15-25 minutes and 4 bottles of formula of 20-32 cc, 2 voids and 2 stools since birth. He is 38w 3d GA. Mom reports that she has some pain with initial latch that improves with feeding. [facility] and is to call to get an appointment tomorrow after D/C. She also has a Ped and will make a follow up appointment tomorrow. Enc mom to feed infant 8-12 x in 24 hours at breast first and then give bottles after. Gave mom LC Brochure in Spanish, reviewed IP Services, OP Services, BF Resources, Support Groups and phone #. Enc mom to call for assistance as needed.    Maternal Data Formula Feeding for Exclusion: No Does the patient have breastfeeding experience prior to this delivery?: Yes  Feeding    LATCH Score/Interventions                      Lactation Tools Discussed/Used WIC Program: Yes   Consult Status Consult Status: Follow-up Date: 04/01/15 Follow-up type: In-patient    Silas FloodSharon S Hice 03/31/2015, 3:59 PM

## 2015-03-31 NOTE — Progress Notes (Signed)
Checked on patients ordered dinner and breakfast.  Spanish Interpreter

## 2015-03-31 NOTE — Progress Notes (Signed)
Post Partum Day 1 Subjective: no complaints, up ad lib, voiding and tolerating PO  Objective: Blood pressure 128/58, pulse 66, temperature 98.2 F (36.8 C), temperature source Oral, resp. rate 20, height 5\' 2"  (1.575 m), weight 202 lb (91.627 kg), unknown if currently breastfeeding.  Physical Exam:  General: alert, cooperative, appears stated age and no distress Lochia: appropriate Uterine Fundus: firm Incision: n/a DVT Evaluation: No evidence of DVT seen on physical exam. Negative Homan's sign. No cords or calf tenderness.   Recent Labs  03/30/15 2010  HGB 11.7*  HCT 35.7*    Assessment/Plan: Plan for discharge tomorrow   LOS: 1 day   Amy Velazquez DARLENE 03/31/2015, 9:17 AM

## 2015-03-31 NOTE — Progress Notes (Signed)
Assisted RN with interpretation until baby delivery.  Spanish Interpreter

## 2015-03-31 NOTE — Progress Notes (Signed)
Assisted Registration with interpretation of patient information.  Spanish Interpreter

## 2015-03-31 NOTE — Progress Notes (Signed)
Assisted RN with interpretation of patient admit to L& D Spanish Interpreter

## 2015-04-01 LAB — RPR: RPR Ser Ql: NONREACTIVE

## 2015-04-01 MED ORDER — NORETHINDRONE 0.35 MG PO TABS: 1.0000 | ORAL_TABLET | Freq: Every day | ORAL | Status: DC

## 2015-04-01 MED ORDER — IBUPROFEN 600 MG PO TABS: 600.0000 mg | ORAL_TABLET | Freq: Four times a day (QID) | ORAL | Status: DC

## 2015-04-01 NOTE — Lactation Note (Signed)
This note was copied from the chart of [name]. Lactation Consultation Note: When LC entered the room Mother was bottle feeding infant formula. She had given infant 55ml. Discussed supply and demand with mother. Mother states she breastfed all 3 other children for 6 months. Mother advised to pump breast after breastfeeding. She was given a hand pump with instructions. She was fit with a #27 flange.  Mother was also shown hand expression and she was able to observed sprays of milk from the right breast. Mother advised to limit formula and to breastfeed more frequently. Advised mother to page [facility] for assistance as needed to check infants latch. Discussed cluster feeding and cue base feeding. Informed mother to massage and ice breast for good milk transfer and to prevent engorgement. Mother happy to see that she has milk. Her breast are filling. Mother is aware of available LC services and community support.   Patient Name: Amy LawsBoy Larosa Hertzog ZOXWR'UToday's Date: 04/01/2015 Reason for consult: Follow-up assessment   Maternal Data    Feeding Feeding Type: Breast Fed  LATCH Score/Interventions Latch: Grasps breast easily, tongue down, lips flanged, rhythmical sucking.  Audible Swallowing: Spontaneous and intermittent  Type of Nipple: Everted at rest and after stimulation  Comfort (Breast/Nipple): Soft / non-tender     Hold (Positioning): No assistance needed to correctly position infant at breast.  LATCH Score: 10  Lactation Tools Discussed/Used     Consult Status Consult Status: Complete    Michel BickersKendrick, Elianie Hubers McCoy 04/01/2015, 2:54 PM

## 2015-04-01 NOTE — Progress Notes (Signed)
Post Partum Day 1 Subjective: no complaints, up ad lib, voiding, tolerating PO and + flatus  Objective: Blood pressure 128/62, pulse 66, temperature 97.5 F (36.4 C), temperature source Oral, resp. rate 18, height 5\' 2"  (1.575 m), weight 202 lb (91.627 kg), SpO2 98 %, unknown if currently breastfeeding.  Physical Exam:  General: alert, cooperative, appears stated age and no distress Lochia: appropriate Uterine Fundus: firm Incision: n/a DVT Evaluation: No evidence of DVT seen on physical exam. Negative Homan's sign. No cords or calf tenderness.   Recent Labs  03/30/15 2010  HGB 11.7*  HCT 35.7*    Assessment/Plan: Plan for discharge tomorrow   LOS: 2 days   LAWSON, MARIE DARLENE 04/01/2015, 7:02 AM

## 2015-04-01 NOTE — Discharge Summary (Signed)
OB Discharge Summary     Patient Name: Amy Velazquez DOB: 1985-12-05 MRN: [id number]  Date of admission: 03/30/2015 Delivering MD: Casey Burkitt   Date of discharge: 04/01/2015  Admitting diagnosis: 36 WKS, WATER BROKE, CTXS Intrauterine pregnancy: [redacted]w[redacted]d     Secondary diagnosis:  Active Problems:   Active labor   NSVD (normal spontaneous vaginal delivery)  Additional problems: None     Discharge diagnosis: Preterm Pregnancy Delivered and inadequate treatment of GBS positive                                                                                                Post partum procedures:none  Augmentation: None  Complications: None  Hospital course:  Onset of Labor With Vaginal Delivery     29 y.o. yo G4P4001 at [redacted]w[redacted]d was admitted in Active Laboron 03/30/2015. Patient had an uncomplicated labor course as follows:  Membrane Rupture Time/Date: 6:47 PM ,03/30/2015   Intrapartum Procedures: Episiotomy: None [1]                                         Lacerations:  None [1]  Patient had a delivery of a Viable infant. 03/30/2015  Information for the patient's newborn:  SYNIAH, BERNE [[id number]]  Delivery Method: Vaginal, Spontaneous Delivery (Filed from Delivery Summary)    Pateint had an uncomplicated postpartum course.  She is ambulating, tolerating a regular diet, passing flatus, and urinating well. Patient is discharged home in stable condition on No discharge date for patient encounter.Marland Kitchen    Physical exam  Filed Vitals:   03/31/15 0428 03/31/15 0911 03/31/15 1813 04/01/15 0600  BP: 113/63 128/58 128/62 123/71  Pulse: 63 66 66 65  Temp: 98.2 F (36.8 C)  97.5 F (36.4 C) 97.9 F (36.6 C)  TempSrc: Oral Oral Oral   Resp: Height:      Weight:      SpO2:   98%    General: alert, cooperative and no distress Lochia: appropriate Uterine Fundus: firm Incision: N/A DVT Evaluation: No evidence of DVT seen on physical  exam. Labs: Lab Results  Component Value Date   WBC 12.1* 03/30/2015   HGB 11.7* 03/30/2015   HCT 35.7* 03/30/2015   MCV 78.5 03/30/2015   PLT 196 03/30/2015   No flowsheet data found.  Discharge instruction: per After Visit Summary and "Baby and Me Booklet".  After visit meds:    Medication List    TAKE these medications        ibuprofen 600 MG tablet  Commonly known as:  ADVIL,MOTRIN  Take 1 tablet (600 mg total) by mouth every 6 (six) hours.     norethindrone 0.35 MG tablet  Commonly known as:  MICRONOR,CAMILA,ERRIN  Take 1 tablet (0.35 mg total) by mouth daily.     prenatal multivitamin Tabs tablet  Take 1 tablet by mouth at bedtime.        Diet: routine diet  Activity: Advance as tolerated. Pelvic rest for  6 weeks.   Outpatient follow up:6 weeks Follow up Appt:No future appointments. Follow up Visit:No Follow-up on file.  Postpartum contraception: Progesterone only pills  Newborn Data: Live born female  Birth Weight: 8 lb 0.4 oz (3640 g) APGAR: 9, 10  Baby Feeding: Breast Disposition:home with mother   04/01/2015 LEFTWICH-KIRBY, Misty StanleyLISA, CNM

## 2015-04-01 NOTE — Progress Notes (Signed)
Pt discharged to home as ordered, all verbal and written d/c instructions given as ordered- pt verbalizes understanding. Pt d/c'd in stable condition and w/out incident.

## 2017-05-25 NOTE — L&D Delivery Note (Addendum)
Patient is 32 y.o. [id number] 8922w2d admitted in labor after SROM at home at 0900. S/p augmention with Pitocin. Prenatal course uncomplicated.   Delivery Note At 4:10 PM a viable female was delivered via Vaginal, Spontaneous (Presentation: ;  ).  APGAR: 9, 9; weight 9 lb 13 oz (4450 g).   Placenta status: intact  Cord: 3 vessel  with no complications  Anesthesia:  none Episiotomy: None Lacerations: None Est. Blood Loss (mL): 500  Mom to postpartum.  Baby to Couplet care / Skin to Skin.  FRIEDMAN, JESSICA L 02/11/2018, 4:41 PM  Head delivered direct OA. No nuchal cord present. Shoulder and body delivered in usual fashion. Infant with spontaneous cry, placed on mother's abdomen, dried and bulb suctioned. Cord clamped x 2 after 1-minute delay, and cut by delivery provider. Cord blood drawn. Placenta delivered spontaneously with gentle cord traction. Fundus firm with massage and IM Pitocin. Perineum inspected and found to have no laceration. Interpreter present at delivery and immediately after.   I attest that I was gloved and present at this delivery and I agree with above documentation.   Luna KitchensKathryn Rajon Bisig

## 2017-08-12 LAB — OB RESULTS CONSOLE RUBELLA ANTIBODY, IGM: Rubella: IMMUNE

## 2017-08-12 LAB — OB RESULTS CONSOLE ANTIBODY SCREEN: Antibody Screen: NEGATIVE

## 2017-08-12 LAB — OB RESULTS CONSOLE ABO/RH: RH TYPE: POSITIVE

## 2017-08-12 LAB — OB RESULTS CONSOLE RPR: RPR: NONREACTIVE

## 2017-08-12 LAB — OB RESULTS CONSOLE HIV ANTIBODY (ROUTINE TESTING): HIV: NONREACTIVE

## 2017-08-12 LAB — OB RESULTS CONSOLE GC/CHLAMYDIA
CHLAMYDIA, DNA PROBE: NEGATIVE
GC PROBE AMP, GENITAL: NEGATIVE

## 2017-08-12 LAB — OB RESULTS CONSOLE HEPATITIS B SURFACE ANTIGEN: Hepatitis B Surface Ag: NEGATIVE

## 2018-01-13 LAB — OB RESULTS CONSOLE GC/CHLAMYDIA
Chlamydia: NEGATIVE
GC PROBE AMP, GENITAL: NEGATIVE

## 2018-01-13 LAB — OB RESULTS CONSOLE GBS: GBS: POSITIVE

## 2018-02-10 ENCOUNTER — Encounter (HOSPITAL_COMMUNITY): Payer: Self-pay | Admitting: *Deleted

## 2018-02-10 ENCOUNTER — Telehealth (HOSPITAL_COMMUNITY): Payer: Self-pay | Admitting: *Deleted

## 2018-02-10 NOTE — Telephone Encounter (Signed)
Preadmission screen  

## 2018-02-11 ENCOUNTER — Other Ambulatory Visit: Payer: Self-pay

## 2018-02-11 ENCOUNTER — Encounter (HOSPITAL_COMMUNITY): Payer: Self-pay

## 2018-02-11 ENCOUNTER — Inpatient Hospital Stay (HOSPITAL_COMMUNITY)
Admission: AD | Admit: 2018-02-11 | Discharge: 2018-02-13 | DRG: 807 | Disposition: A | Discharge status: Home / Self Care | Payer: Medicaid Other | Attending: Obstetrics and Gynecology | Admitting: Obstetrics and Gynecology

## 2018-02-11 DIAGNOSIS — Z3A40 40 weeks gestation of pregnancy: Secondary | ICD-10-CM

## 2018-02-11 DIAGNOSIS — O99824 Streptococcus B carrier state complicating childbirth: Secondary | ICD-10-CM | POA: Diagnosis present

## 2018-02-11 DIAGNOSIS — Z3483 Encounter for supervision of other normal pregnancy, third trimester: Secondary | ICD-10-CM | POA: Diagnosis present

## 2018-02-11 DIAGNOSIS — O480 Post-term pregnancy: Secondary | ICD-10-CM | POA: Diagnosis not present

## 2018-02-11 LAB — CBC
HEMATOCRIT: 37.6 % (ref 36.0–46.0)
HEMOGLOBIN: 12.3 g/dL (ref 12.0–15.0)
MCH: 27 pg (ref 26.0–34.0)
MCHC: 32.7 g/dL (ref 30.0–36.0)
MCV: 82.5 fL (ref 78.0–100.0)
Platelets: 205 10*3/uL (ref 150–400)
RBC: 4.56 MIL/uL (ref 3.87–5.11)
RDW: 14.5 % (ref 11.5–15.5)
WBC: 12.3 10*3/uL — ABNORMAL HIGH (ref 4.0–10.5)

## 2018-02-11 LAB — TYPE AND SCREEN
ABO/RH(D): AB POS
Antibody Screen: NEGATIVE

## 2018-02-11 LAB — POCT FERN TEST: POCT Fern Test: POSITIVE

## 2018-02-11 MED ORDER — OXYTOCIN 40 UNITS IN LACTATED RINGERS INFUSION - SIMPLE MED
INTRAVENOUS | Status: AC
  Filled 2018-02-11: qty 1000

## 2018-02-11 MED ORDER — OXYTOCIN 10 UNIT/ML IJ SOLN
INTRAMUSCULAR | Status: AC
  Administered 2018-02-11: 10 [IU]
  Filled 2018-02-11: qty 1

## 2018-02-11 MED ORDER — OXYTOCIN 40 UNITS IN LACTATED RINGERS INFUSION - SIMPLE MED: 2.5000 [IU]/h | INTRAVENOUS | Status: DC

## 2018-02-11 MED ORDER — LIDOCAINE HCL (PF) 1 % IJ SOLN
INTRAMUSCULAR | Status: AC
  Filled 2018-02-11: qty 30

## 2018-02-11 MED ORDER — LIDOCAINE HCL (PF) 1 % IJ SOLN: 30.0000 mL | INTRAMUSCULAR | Status: DC | PRN

## 2018-02-11 MED ORDER — ZOLPIDEM TARTRATE 5 MG PO TABS: 5.0000 mg | ORAL_TABLET | Freq: Every evening | ORAL | Status: DC | PRN

## 2018-02-11 MED ORDER — ONDANSETRON HCL 4 MG/2ML IJ SOLN: 4.0000 mg | INTRAMUSCULAR | Status: DC | PRN

## 2018-02-11 MED ORDER — TETANUS-DIPHTH-ACELL PERTUSSIS 5-2.5-18.5 LF-MCG/0.5 IM SUSP: 0.5000 mL | Freq: Once | INTRAMUSCULAR | Status: DC

## 2018-02-11 MED ORDER — BENZOCAINE-MENTHOL 20-0.5 % EX AERO: 1.0000 "application " | INHALATION_SPRAY | CUTANEOUS | Status: DC | PRN

## 2018-02-11 MED ORDER — SENNOSIDES-DOCUSATE SODIUM 8.6-50 MG PO TABS
2.0000 | ORAL_TABLET | ORAL | Status: DC
  Administered 2018-02-11 – 2018-02-13 (×2): 2 via ORAL
  Filled 2018-02-11 (×2): qty 2

## 2018-02-11 MED ORDER — DIBUCAINE 1 % RE OINT: 1.0000 "application " | TOPICAL_OINTMENT | RECTAL | Status: DC | PRN

## 2018-02-11 MED ORDER — ACETAMINOPHEN 325 MG PO TABS
650.0000 mg | ORAL_TABLET | ORAL | Status: DC | PRN
  Administered 2018-02-11 – 2018-02-12 (×2): 650 mg via ORAL
  Filled 2018-02-11 (×2): qty 2

## 2018-02-11 MED ORDER — FENTANYL CITRATE (PF) 100 MCG/2ML IJ SOLN
100.0000 ug | INTRAMUSCULAR | Status: DC | PRN
  Administered 2018-02-11 (×3): 100 ug via INTRAVENOUS
  Filled 2018-02-11 (×2): qty 2

## 2018-02-11 MED ORDER — PRENATAL MULTIVITAMIN CH
1.0000 | ORAL_TABLET | Freq: Every day | ORAL | Status: DC
  Administered 2018-02-11 – 2018-02-12 (×2): 1 via ORAL
  Filled 2018-02-11 (×3): qty 1

## 2018-02-11 MED ORDER — COCONUT OIL OIL: 1.0000 "application " | TOPICAL_OIL | Status: DC | PRN

## 2018-02-11 MED ORDER — OXYCODONE-ACETAMINOPHEN 5-325 MG PO TABS: 1.0000 | ORAL_TABLET | ORAL | Status: DC | PRN

## 2018-02-11 MED ORDER — SODIUM CHLORIDE 0.9 % IV SOLN
2.0000 g | Freq: Four times a day (QID) | INTRAVENOUS | Status: DC
  Administered 2018-02-11: 2 g via INTRAVENOUS
  Filled 2018-02-11 (×2): qty 2000
  Filled 2018-02-11: qty 2

## 2018-02-11 MED ORDER — SOD CITRATE-CITRIC ACID 500-334 MG/5ML PO SOLN: 30.0000 mL | ORAL | Status: DC | PRN

## 2018-02-11 MED ORDER — OXYTOCIN BOLUS FROM INFUSION: 500.0000 mL | Freq: Once | INTRAVENOUS | Status: DC

## 2018-02-11 MED ORDER — OXYTOCIN 10 UNIT/ML IJ SOLN
INTRAMUSCULAR | Status: AC
  Filled 2018-02-11: qty 1

## 2018-02-11 MED ORDER — OXYTOCIN 40 UNITS IN LACTATED RINGERS INFUSION - SIMPLE MED
1.0000 m[IU]/min | INTRAVENOUS | Status: DC
  Administered 2018-02-11: 2 m[IU]/min via INTRAVENOUS

## 2018-02-11 MED ORDER — IBUPROFEN 600 MG PO TABS
600.0000 mg | ORAL_TABLET | Freq: Four times a day (QID) | ORAL | Status: DC
  Administered 2018-02-11 – 2018-02-13 (×8): 600 mg via ORAL
  Filled 2018-02-11 (×9): qty 1

## 2018-02-11 MED ORDER — FENTANYL CITRATE (PF) 100 MCG/2ML IJ SOLN
100.0000 ug | INTRAMUSCULAR | Status: DC | PRN
  Filled 2018-02-11: qty 2

## 2018-02-11 MED ORDER — ONDANSETRON HCL 4 MG/2ML IJ SOLN: 4.0000 mg | Freq: Four times a day (QID) | INTRAMUSCULAR | Status: DC | PRN

## 2018-02-11 MED ORDER — OXYCODONE-ACETAMINOPHEN 5-325 MG PO TABS: 2.0000 | ORAL_TABLET | ORAL | Status: DC | PRN

## 2018-02-11 MED ORDER — PENICILLIN G 3 MILLION UNITS IVPB - SIMPLE MED
3.0000 10*6.[IU] | INTRAVENOUS | Status: DC
  Filled 2018-02-11: qty 100

## 2018-02-11 MED ORDER — ACETAMINOPHEN 325 MG PO TABS: 650.0000 mg | ORAL_TABLET | ORAL | Status: DC | PRN

## 2018-02-11 MED ORDER — LACTATED RINGERS IV SOLN
INTRAVENOUS | Status: DC
  Administered 2018-02-11: 11:00:00 via INTRAVENOUS

## 2018-02-11 MED ORDER — FLEET ENEMA 7-19 GM/118ML RE ENEM: 1.0000 | ENEMA | RECTAL | Status: DC | PRN

## 2018-02-11 MED ORDER — DIPHENHYDRAMINE HCL 25 MG PO CAPS: 25.0000 mg | ORAL_CAPSULE | Freq: Four times a day (QID) | ORAL | Status: DC | PRN

## 2018-02-11 MED ORDER — INFLUENZA VAC SPLIT QUAD 0.5 ML IM SUSY: 0.5000 mL | PREFILLED_SYRINGE | INTRAMUSCULAR | Status: DC

## 2018-02-11 MED ORDER — WITCH HAZEL-GLYCERIN EX PADS: 1.0000 "application " | MEDICATED_PAD | CUTANEOUS | Status: DC | PRN

## 2018-02-11 MED ORDER — SODIUM CHLORIDE 0.9 % IV SOLN
5.0000 10*6.[IU] | Freq: Once | INTRAVENOUS | Status: DC
  Filled 2018-02-11: qty 5

## 2018-02-11 MED ORDER — LACTATED RINGERS IV SOLN: 500.0000 mL | INTRAVENOUS | Status: DC | PRN

## 2018-02-11 MED ORDER — SIMETHICONE 80 MG PO CHEW: 80.0000 mg | CHEWABLE_TABLET | ORAL | Status: DC | PRN

## 2018-02-11 MED ORDER — TERBUTALINE SULFATE 1 MG/ML IJ SOLN: 0.2500 mg | Freq: Once | INTRAMUSCULAR | Status: DC | PRN

## 2018-02-11 MED ORDER — ONDANSETRON HCL 4 MG PO TABS: 4.0000 mg | ORAL_TABLET | ORAL | Status: DC | PRN

## 2018-02-11 NOTE — MAU Provider Note (Addendum)
Pt informed that the ultrasound is considered a limited OB ultrasound and is not intended to be a complete ultrasound exam.  Patient also informed that the ultrasound is not being completed with the intent of assessing for fetal or placental anomalies or any pelvic abnormalities.  Explained that the purpose of today's ultrasound is to assess for  presentation -- baby found to be in vertex position.  Patient acknowledges the purpose of the exam and the limitations of the study.    NST - FHR: 140 bpm / moderate variability / accels present / decels absent / TOCO: regular every 5 mins   VE: 6cm/90%/-2/vertex with caput -- exam by R. Arita Missawson, CNM  Raelyn Moraolitta Emsley Custer, CNM  02/11/2018 10:24 AM

## 2018-02-11 NOTE — H&P (Addendum)
OBSTETRIC ADMISSION HISTORY AND PHYSICAL  Amy Velazquez is a 32 y.o. female [phone] with IUP at [redacted]w[redacted]d by last menstrual period presenting for SOL. She reports that contractions started today at 0100 and she had a gush of fluid at 0900.  She reports +Fms and no VB.  She plans on breast and bottle feeding. She request Paragard for birth control. She reports no complications with the birth of previous children. She received her prenatal [facility] at [facility]   Dating: By last menstrual period --->  Estimated Date of Delivery: 02/09/18  Sono:  Not found   Prenatal History/Complications:  Past Medical History: Past Medical History:  Diagnosis Date  . Medical history non-contributory     Past Surgical History: Past Surgical History:  Procedure Laterality Date  . NO PAST SURGERIES      Obstetrical History: OB History    Gravida  5   Para  4   Term  4   Preterm      AB      Living  4     SAB      TAB      Ectopic      Multiple  0   Live Births  4           Social History: Social History   Socioeconomic History  . Marital status: Married    Spouse name: Not on file  . Number of children: Not on file  . Years of education: Not on file  . Highest education level: Not on file  Occupational History  . Not on file  Social Needs  . Financial resource strain: Not on file  . Food insecurity:    Worry: Not on file    Inability: Not on file  . Transportation needs:    Medical: Not on file    Non-medical: Not on file  Tobacco Use  . Smoking status: Never Smoker  . Smokeless tobacco: Never Used  Substance and Sexual Activity  . Alcohol use: No  . Drug use: No  . Sexual activity: Yes    Birth control/protection: None  Lifestyle  . Physical activity:    Days per week: Not on file    Minutes per session: Not on file  . Stress: Not on file  Relationships  . Social connections:    Talks on phone: Not on file    Gets together: Not on file    Attends  religious service: Not on file    Active member of club or organization: Not on file    Attends meetings of clubs or organizations: Not on file    Relationship status: Not on file  Other Topics Concern  . Not on file  Social History Narrative  . Not on file    Family History: Family History  Problem Relation Age of Onset  . Diabetes Mother     Allergies: No Known Allergies  Medications Prior to Admission  Medication Sig Dispense Refill Last Dose  . Prenatal Vit-Fe Fumarate-FA (PRENATAL MULTIVITAMIN) TABS tablet Take 1 tablet by mouth at bedtime.   02/10/2018 at Unknown time     Review of Systems   All systems reviewed and negative except as stated in HPI  Blood pressure (!) 142/84, pulse 81, temperature 99 F (37.2 C), resp. rate 20, height 5\' 2"  (1.575 m), weight 93.5 kg, SpO2 98 %, unknown if currently breastfeeding. General appearance: alert and cooperative Lungs: clear to auscultation bilaterally Heart: regular rate and rhythm Abdomen: soft, non-tender;  bowel sounds normal Extremities: Homans sign is negative, no sign of DVT Presentation: cephalic Fetal monitoringBaseline: 125 bpm, positive accelerations, no decelerations Uterine activityFrequency: Every 2-3 minutes     Prenatal labs: ABO, Rh: AB/Positive/-- (03/21 0000) Antibody: n (03/21 0000) Rubella: Immune (03/21 0000) RPR: Nonreactive (03/21 0000)  HBsAg: Negative (03/21 0000)  HIV: Non-reactive (03/21 0000)  GBS: Positive (08/22 0000)  1 hr Glucola abnormal but 3 hour within normal limits Genetic screening  N/A Anatomy US N/A  Prenatal Transfer Tool  Maternal Diabetes: no Genetic Screening: Normal Maternal Ultrasounds/Referrals: Normal Fetal Ultrasounds or other Referrals:  None Maternal Substance Abuse:  No Significant Maternal Medications:  None Significant Maternal Lab Results: None  Results for orders placed or performed during the hospital encounter of 02/11/18 (from the past 24 hour(s))   POCT fern test   Collection Time: 02/11/18 10:06 AM  Result Value Ref Range   POCT Fern Test Positive = ruptured amniotic membanes     Patient Active Problem List   Diagnosis Date Noted  . Normal labor 02/11/2018  . NSVD (normal spontaneous vaginal delivery) 04/01/2015  . Active labor 03/30/2015  . [redacted] weeks gestation of pregnancy   . Evaluate anatomy not seen on prior sonogram   . Encounter for fetal anatomic survey   . [redacted] weeks gestation of pregnancy     Assessment/Plan:  Amy Velazquez is a 32 y.o. Z6X0960G5P4004 at 1769w2d here for SOL with rupture of membranes.   #Labor: SROM at home. progressing without augmentation- contraction pattern is irregular, consider adding pitocin #Pain: Currently being controlled with IV fentanyl  #FWB: category 1 #ID:  GBS positive, prophylaxis with ampicillin #MOF: breast and bottle #MOC:Paragard #Circ:  No  Charyl Dancerrin Horst, Student-PA  02/11/2018, 10:51 AM  I confirm that I have verified the information documented in the PA note and that I have also personally reperformed the physical exam and all medical decision making activities.  Luna KitchensKAthryn Kooistra

## 2018-02-11 NOTE — Progress Notes (Signed)
Medical interpreter Bonnye FavaViria in patient's room for further education including infant's feeding/feeding sheet, keeping infant STS or swaddled, and amount/frequency of formula supplementation per mother's choice. Mother also reminded to call for assist to BR, complete edinburgh, and pain assessment done. Call light in reach. Family in room. Patient's questions answered and she verbalizes understanding.

## 2018-02-11 NOTE — Progress Notes (Signed)
   Amy DowdyMaribel Velazquez is a 32 y.o. [id number] at 5275w2d  admitted for rupture of membranes  Subjective: Patient feeling urge to push.   Objective: Vitals:   02/11/18 1412 02/11/18 1438 02/11/18 1505 02/11/18 1519  BP:  (!) 146/86  130/61  Pulse:  90  82  Resp: 18 20 18  (P) 18  Temp:      TempSrc:      SpO2:      Weight:      Height:       No intake/output data recorded.  FHT:  FHR: 120 bpm, variability: moderate,  accelerations:  Present,  decelerations:  Present one variable, occasional earlies UC:   irregular, every 2-4 minutes SVE:   Dilation: Lip/rim Effacement (%): 100 Station: Plus 1 Exam by:: Valentina Lucks. Woods, RN Pitocin @ 2 mu/min  Labs: Lab Results  Component Value Date   WBC 12.3 (H) 02/11/2018   HGB 12.3 02/11/2018   HCT 37.6 02/11/2018   MCV 82.5 02/11/2018   PLT 205 02/11/2018    Assessment / Plan: Spontaneous labor, progressing normally. Patient has not developed a consistent contraction pattern since leaking at 1 am and SROM at 9 am so will begin pitocin 2x2.  GBS infection prophylaxis treatment begun.    Labor: progressing on pitocin. Not able to reduce cervix over head, will reduce pitocin to 2, reposition, reassess Fetal Wellbeing:  Category II with one variable, otherwise no decels with moderate variability Pain Control:  Labor support without medications, last fentanyl 1 hr ago, too far along for further fentanyl Anticipated MOD:  NSVD  Mahin Guardia L 02/11/2018, 3:20 PM

## 2018-02-11 NOTE — Lactation Note (Signed)
This note was copied from a baby's chart. Lactation Consultation Note  Patient Name: Amy Lamont DowdyMaribel Grigg ONGEX'BToday's Date: 02/11/2018 Reason for consult: Initial assessment;Term  4 hours old FT female who is being partially BF and formula fed by his mother, she's a P5 and experienced BF. She was able to BF her other kids for 6 months. [facility] Baby is 9-13 lbs and he's on [employer] Gentle; reviewed with parents supplementation guidelines according to baby's age.  Offered assistance with latch but mom politely declined stating the baby already fed. Asked mom to call for latch assistance when needed. When reviewing hand expression with mom colostrum was noted, mom voiced she doesn't have a pump at home, [facility]. Pump instructions, cleaning and storage were reviewed as well as milk storage guidelines.  Plan:   1. Encouraged mom to feed baby at the breast 8-12 times/24 hours or sooner if feeding cues are present 2. If parents choose to supplement they'll do so according to formula chart with volumes that are appropriate for baby's age. Supplementation volumes will be revised and adjusted if baby develops low blood sugar. 3. Parents will request slow flow nipples when asking for formula, standard flow nipples were brought to the room. LC switched them for slow flow.  BF brochure (SP), BF resources and feeding diary (SP) were discussed, both parents are aware of LC services and will call PRN.  Maternal Data Formula Feeding for Exclusion: Yes Reason for exclusion: Mother's choice to formula and breast feed on admission Has patient been taught Hand Expression?: Yes Does the patient have breastfeeding experience prior to this delivery?: Yes  Feeding Feeding Type: Breast Fed Length of feed: 30 min  LATCH Score Latch: Repeated attempts needed to sustain latch, nipple held in mouth throughout feeding, stimulation needed to elicit  sucking reflex.  Audible Swallowing: A few with stimulation  Type of Nipple: Everted at rest and after stimulation  Comfort (Breast/Nipple): Soft / non-tender  Hold (Positioning): No assistance needed to correctly position infant at breast.  LATCH Score: 8  Interventions Interventions: Breast feeding basics reviewed;Hand pump;Breast compression;Hand express;Breast massage  Lactation Tools Discussed/Used Tools: Pump Breast pump type: Manual WIC Program: Yes Pump Review: Setup, frequency, and cleaning;Milk Storage Initiated by:: MPeck Date initiated:: 02/11/18   Consult Status Consult Status: Follow-up Date: 02/12/18 Follow-up type: In-patient    Prezley Qadir Venetia ConstableS Ariellah Faust 02/11/2018, 9:01 PM

## 2018-02-11 NOTE — Progress Notes (Signed)
   Amy Velazquez is a 32 y.o. [id number] at 2078w2d  admitted for rupture of membranes  Subjective: Patient coping well; 100cg of fentanyl given for pain.   Objective: Vitals:   02/11/18 1045 02/11/18 1109 02/11/18 1114 02/11/18 1121  BP: (!) 142/84   134/75  Pulse: 81   77  Resp: 20 18 20 18   Temp: 99 F (37.2 C)     TempSrc:      SpO2:      Weight:      Height:       No intake/output data recorded.  FHT:  FHR: 135 bpm, variability: moderate,  accelerations:  Present,  decelerations:  Absent UC:   irregular, every 3-4 minutes SVE:   Dilation: 6.5 Effacement (%): 90 Station: -1 Exam by:: Valentina Lucks. Woods, RN Pitocin @ 2 mu/min  Labs: Lab Results  Component Value Date   WBC 12.1 (H) 03/30/2015   HGB 11.7 (L) 03/30/2015   HCT 35.7 (L) 03/30/2015   MCV 78.5 03/30/2015   PLT 196 03/30/2015    Assessment / Plan: Spontaneous labor, progressing normally. Patient has not developed a consistent contraction pattern since leaking at 1 am and SROM at 9 am so will begin pitocin 2x2.  GBS infection prophylaxis treatment begun.    Labor: Progressing on Pitocin, will continue to increase then AROM Fetal Wellbeing:  Category I Pain Control:  IV pain meds Anticipated MOD:  NSVD  Amy Velazquez 02/11/2018, 11:40 AM

## 2018-02-11 NOTE — MAU Note (Signed)
Pt.  States she had some LOF around 0100 this morning, then a "gush of fluid" around 0600 and another gush on way to hospital.  Pt. Reports irregular ctx. Pt. Denies vag. Bleeding or dc.

## 2018-02-12 LAB — RPR: RPR Ser Ql: NONREACTIVE

## 2018-02-12 NOTE — Progress Notes (Signed)
POSTPARTUM PROGRESS NOTE  Post Partum Day 1 Subjective:  Amy Velazquez is a 32 y.o. [id number] 4665w2d s/p NSVD.  No acute events overnight.  Pt denies problems with ambulating, voiding or po intake.  She denies nausea or vomiting.  Pain is well controlled. Lochia Moderate.   Objective: Blood pressure 120/65, pulse 70, temperature 98.2 F (36.8 C), temperature source Oral, resp. rate 16, height 5\' 2"  (1.575 m), weight 93.5 kg, SpO2 98 %, unknown if currently breastfeeding.  Physical Exam:  General: alert, cooperative and no distress Lochia:normal flow Chest: no respiratory distress Heart:regular rate, distal pulses intact Abdomen: soft, nontender,  Uterine Fundus: firm, appropriately tender DVT Evaluation: No calf swelling or tenderness Extremities: no edema  Recent Labs    02/11/18 1102  HGB 12.3  HCT 37.6    Assessment/Plan:  ASSESSMENT: Amy Velazquez is a 32 y.o. [id number] 6165w2d s/p NSVD, doing well.   Plan for discharge tomorrow and Contraception IUD   LOS: 1 day   Amy Velazquez LDO 02/12/2018, 4:08 AM

## 2018-02-13 MED ORDER — IBUPROFEN 600 MG PO TABS: 600.0000 mg | ORAL_TABLET | Freq: Four times a day (QID) | ORAL | 0 refills | Status: AC

## 2018-02-13 MED ORDER — OXYCODONE-ACETAMINOPHEN 5-325 MG PO TABS: 2.0000 | ORAL_TABLET | ORAL | 0 refills | Status: DC | PRN

## 2018-02-13 NOTE — Discharge Summary (Signed)
OB Discharge Summary  Patient Name: Amy Velazquez DOB: 1986/02/04 MRN: [id number]  Date of admission: 02/11/2018 Delivering MD: Aura Camps L   Date of discharge: 02/13/2018  Admitting diagnosis: 40WKS WATER BROKE CTX 5-10 MINS Intrauterine pregnancy: [redacted]w[redacted]d     Secondary diagnosis:Active Problems:   Normal labor  Additional problems:none     Discharge diagnosis: Term Pregnancy Delivered                                                                     Post partum procedures:n/a  Augmentation: n/a  Complications: None  Hospital course:  Onset of Labor With Vaginal Delivery     32 y.o. yo G5P5005 at [redacted]w[redacted]d was admitted in Active Labor on 02/11/2018. Patient had an uncomplicated labor course as follows:  Membrane Rupture Time/Date: 1:00 AM ,02/11/2018   Intrapartum Procedures: Episiotomy: None [1]                                         Lacerations:  None [1]  Patient had a delivery of a Viable infant. 02/11/2018  Information for the patient's newborn:  DARBI, CHANDRAN [[id number]]  Delivery Method: Vag-Spont    Pateint had an uncomplicated postpartum course.  She is ambulating, tolerating a regular diet, passing flatus, and urinating well. Patient is discharged home in stable condition on 02/13/18.   Physical exam  Vitals:   02/12/18 0600 02/12/18 1358 02/12/18 2232 02/13/18 0515  BP:  118/62 117/65 117/70  Pulse: 67 84 74 66  Resp: 16 16 16 16   Temp: 97.9 F (36.6 C) 98.3 F (36.8 C) 98.4 F (36.9 C) 99.4 F (37.4 C)  TempSrc: Oral Oral Oral Oral  SpO2:   99% 99%  Weight:      Height:       General: alert, cooperative and no distress Lochia: appropriate Uterine Fundus: firm Incision: N/A DVT Evaluation: No evidence of DVT seen on physical exam. Labs: Lab Results  Component Value Date   WBC 12.3 (H) 02/11/2018   HGB 12.3 02/11/2018   HCT 37.6 02/11/2018   MCV 82.5 02/11/2018   PLT 205 02/11/2018   No flowsheet data  found.  Discharge instruction: per After Visit Summary and "Baby and Me Booklet".  After Visit Meds:  Allergies as of 02/13/2018   No Known Allergies     Medication List    TAKE these medications   ibuprofen 600 MG tablet Commonly known as:  ADVIL,MOTRIN Take 1 tablet (600 mg total) by mouth every 6 (six) hours.   oxyCODONE-acetaminophen 5-325 MG tablet Commonly known as:  PERCOCET/ROXICET Take 2 tablets by mouth every 4 (four) hours as needed for severe pain.   prenatal multivitamin Tabs tablet Take 1 tablet by mouth at bedtime.       Diet: routine diet  Activity: Advance as tolerated. Pelvic rest for 6 weeks.   Outpatient follow up:6 weeks Follow up Appt:No future appointments. Follow up visit: No follow-ups on file.  Postpartum contraception: IUD Mirena  Newborn Data: Live born female  Birth Weight: 9 lb 13 oz (4450 g) APGAR: 9, 9  Newborn Delivery  Birth date/time:  02/11/2018 16:10:00 Delivery type:  Vaginal, Spontaneous     Baby Feeding: Bottle and Breast Disposition:home with mother   02/13/2018 Wyvonnia DuskyMarie Lawson, CNM

## 2018-02-18 ENCOUNTER — Inpatient Hospital Stay (HOSPITAL_COMMUNITY): Admission: RE | Admit: 2018-02-18 | Payer: Medicaid Other | Source: Ambulatory Visit

## 2019-11-17 ENCOUNTER — Other Ambulatory Visit: Payer: Self-pay

## 2019-11-17 ENCOUNTER — Ambulatory Visit (INDEPENDENT_AMBULATORY_CARE_PROVIDER_SITE_OTHER): Payer: Self-pay | Admitting: Podiatry

## 2019-11-17 ENCOUNTER — Other Ambulatory Visit: Payer: Self-pay | Admitting: Podiatry

## 2019-11-17 ENCOUNTER — Encounter: Payer: Self-pay | Admitting: Podiatry

## 2019-11-17 ENCOUNTER — Ambulatory Visit (INDEPENDENT_AMBULATORY_CARE_PROVIDER_SITE_OTHER): Payer: Self-pay

## 2019-11-17 VITALS — BP 147/87 | HR 77

## 2019-11-17 DIAGNOSIS — M216X2 Other acquired deformities of left foot: Secondary | ICD-10-CM

## 2019-11-17 DIAGNOSIS — M62462 Contracture of muscle, left lower leg: Secondary | ICD-10-CM

## 2019-11-17 DIAGNOSIS — M722 Plantar fascial fibromatosis: Secondary | ICD-10-CM

## 2019-11-17 DIAGNOSIS — M21861 Other specified acquired deformities of right lower leg: Secondary | ICD-10-CM

## 2019-11-17 DIAGNOSIS — M21862 Other specified acquired deformities of left lower leg: Secondary | ICD-10-CM

## 2019-11-17 DIAGNOSIS — M79671 Pain in right foot: Secondary | ICD-10-CM

## 2019-11-17 DIAGNOSIS — M6788 Other specified disorders of synovium and tendon, other site: Secondary | ICD-10-CM

## 2019-11-17 DIAGNOSIS — M79672 Pain in left foot: Secondary | ICD-10-CM

## 2019-11-17 DIAGNOSIS — M62461 Contracture of muscle, right lower leg: Secondary | ICD-10-CM

## 2019-11-17 DIAGNOSIS — M216X1 Other acquired deformities of right foot: Secondary | ICD-10-CM

## 2019-11-17 MED ORDER — NAPROXEN 500 MG PO TABS: 500.0000 mg | ORAL_TABLET | Freq: Two times a day (BID) | ORAL | 0 refills | Status: DC

## 2019-11-17 MED ORDER — PREDNISONE 5 MG PO TABS: ORAL_TABLET | ORAL | 0 refills | Status: DC

## 2019-11-17 MED ORDER — NAPROXEN 500 MG PO TABS: 500.0000 mg | ORAL_TABLET | Freq: Two times a day (BID) | ORAL | 0 refills | Status: AC

## 2019-11-17 MED ORDER — PREDNISONE 5 MG PO TABS: ORAL_TABLET | ORAL | 0 refills | Status: AC

## 2019-11-17 NOTE — Patient Instructions (Addendum)
Plantar Fasciitis (Heel Spur Syndrome) with Rehab The plantar fascia is a fibrous, ligament-like, soft-tissue structure that spans the bottom of the foot. Plantar fasciitis is a condition that causes pain in the foot due to inflammation of the tissue. SYMPTOMS   Pain and tenderness on the underneath side of the foot.  Pain that worsens with standing or walking. CAUSES  Plantar fasciitis is caused by irritation and injury to the plantar fascia on the underneath side of the foot. Common mechanisms of injury include:  Direct trauma to bottom of the foot.  Damage to a small nerve that runs under the foot where the main fascia attaches to the heel bone.  Stress placed on the plantar fascia due to bone spurs. RISK INCREASES WITH:   Activities that place stress on the plantar fascia (running, jumping, pivoting, or cutting).  Poor strength and flexibility.  Improperly fitted shoes.  Tight calf muscles.  Flat feet.  Failure to warm-up properly before activity.  Obesity. PREVENTION  Warm up and stretch properly before activity.  Allow for adequate recovery between workouts.  Maintain physical fitness:  Strength, flexibility, and endurance.  Cardiovascular fitness.  Maintain a health body weight.  Avoid stress on the plantar fascia.  Wear properly fitted shoes, including arch supports for individuals who have flat feet.  PROGNOSIS  If treated properly, then the symptoms of plantar fasciitis usually resolve without surgery. However, occasionally surgery is necessary.  RELATED COMPLICATIONS   Recurrent symptoms that may result in a chronic condition.  Problems of the lower back that are caused by compensating for the injury, such as limping.  Pain or weakness of the foot during push-off following surgery.  Chronic inflammation, scarring, and partial or complete fascia tear, occurring more often from repeated injections.  TREATMENT  Treatment initially involves the  use of ice and medication to help reduce pain and inflammation. The use of strengthening and stretching exercises may help reduce pain with activity, especially stretches of the Achilles tendon. These exercises may be performed at home or with a therapist. Your caregiver may recommend that you use heel cups of arch supports to help reduce stress on the plantar fascia. Occasionally, corticosteroid injections are given to reduce inflammation. If symptoms persist for greater than 6 months despite non-surgical (conservative), then surgery may be recommended.   MEDICATION   If pain medication is necessary, then nonsteroidal anti-inflammatory medications, such as aspirin and ibuprofen, or other minor pain relievers, such as acetaminophen, are often recommended.  Do not take pain medication within 7 days before surgery.  Prescription pain relievers may be given if deemed necessary by your caregiver. Use only as directed and only as much as you need.  Corticosteroid injections may be given by your caregiver. These injections should be reserved for the most serious cases, because they may only be given a certain number of times.  HEAT AND COLD  Cold treatment (icing) relieves pain and reduces inflammation. Cold treatment should be applied for 10 to 15 minutes every 2 to 3 hours for inflammation and pain and immediately after any activity that aggravates your symptoms. Use ice packs or massage the area with a piece of ice (ice massage).  Heat treatment may be used prior to performing the stretching and strengthening activities prescribed by your caregiver, physical therapist, or athletic trainer. Use a heat pack or soak the injury in warm water.  SEEK IMMEDIATE MEDICAL CARE IF:  Treatment seems to offer no benefit, or the condition worsens.  Any medications   produce adverse side effects.  EXERCISES- RANGE OF MOTION (ROM) AND STRETCHING EXERCISES - Plantar Fasciitis (Heel Spur Syndrome) These exercises  may help you when beginning to rehabilitate your injury. Your symptoms may resolve with or without further involvement from your physician, physical therapist or athletic trainer. While completing these exercises, remember:   Restoring tissue flexibility helps normal motion to return to the joints. This allows healthier, less painful movement and activity.  An effective stretch should be held for at least 30 seconds.  A stretch should never be painful. You should only feel a gentle lengthening or release in the stretched tissue.  RANGE OF MOTION - Toe Extension, Flexion  Sit with your right / left leg crossed over your opposite knee.  Grasp your toes and gently pull them back toward the top of your foot. You should feel a stretch on the bottom of your toes and/or foot.  Hold this stretch for 10 seconds.  Now, gently pull your toes toward the bottom of your foot. You should feel a stretch on the top of your toes and or foot.  Hold this stretch for 10 seconds. Repeat  times. Complete this stretch 3 times per day.   RANGE OF MOTION - Ankle Dorsiflexion, Active Assisted  Remove shoes and sit on a chair that is preferably not on a carpeted surface.  Place right / left foot under knee. Extend your opposite leg for support.  Keeping your heel down, slide your right / left foot back toward the chair until you feel a stretch at your ankle or calf. If you do not feel a stretch, slide your bottom forward to the edge of the chair, while still keeping your heel down.  Hold this stretch for 10 seconds. Repeat 3 times. Complete this stretch 2 times per day.   STRETCH  Gastroc, Standing  Place hands on wall.  Extend right / left leg, keeping the front knee somewhat bent.  Slightly point your toes inward on your back foot.  Keeping your right / left heel on the floor and your knee straight, shift your weight toward the wall, not allowing your back to arch.  You should feel a gentle stretch  in the right / left calf. Hold this position for 10 seconds. Repeat 3 times. Complete this stretch 2 times per day.  STRETCH  Soleus, Standing  Place hands on wall.  Extend right / left leg, keeping the other knee somewhat bent.  Slightly point your toes inward on your back foot.  Keep your right / left heel on the floor, bend your back knee, and slightly shift your weight over the back leg so that you feel a gentle stretch deep in your back calf.  Hold this position for 10 seconds. Repeat 3 times. Complete this stretch 2 times per day.  STRETCH  Gastrocsoleus, Standing  Note: This exercise can place a lot of stress on your foot and ankle. Please complete this exercise only if specifically instructed by your caregiver.   Place the ball of your right / left foot on a step, keeping your other foot firmly on the same step.  Hold on to the wall or a rail for balance.  Slowly lift your other foot, allowing your body weight to press your heel down over the edge of the step.  You should feel a stretch in your right / left calf.  Hold this position for 10 seconds.  Repeat this exercise with a slight bend in your right /   left knee. Repeat 3 times. Complete this stretch 2 times per day.   STRENGTHENING EXERCISES - Plantar Fasciitis (Heel Spur Syndrome)  These exercises may help you when beginning to rehabilitate your injury. They may resolve your symptoms with or without further involvement from your physician, physical therapist or athletic trainer. While completing these exercises, remember:   Muscles can gain both the endurance and the strength needed for everyday activities through controlled exercises.  Complete these exercises as instructed by your physician, physical therapist or athletic trainer. Progress the resistance and repetitions only as guided.  STRENGTH - Towel Curls  Sit in a chair positioned on a non-carpeted surface.  Place your foot on a towel, keeping your heel  on the floor.  Pull the towel toward your heel by only curling your toes. Keep your heel on the floor. Repeat 3 times. Complete this exercise 2 times per day.  STRENGTH - Ankle Inversion  Secure one end of a rubber exercise band/tubing to a fixed object (table, pole). Loop the other end around your foot just before your toes.  Place your fists between your knees. This will focus your strengthening at your ankle.  Slowly, pull your big toe up and in, making sure the band/tubing is positioned to resist the entire motion.  Hold this position for 10 seconds.  Have your muscles resist the band/tubing as it slowly pulls your foot back to the starting position. Repeat 3 times. Complete this exercises 2 times per day.  Document Released: 05/11/2005 Document Revised: 08/03/2011 Document Reviewed: 08/23/2008 [employer] Patient Information 2014 [employer]. Achilles Tendinitis  with Rehab Achilles tendinitis is a disorder of the Achilles tendon. The Achilles tendon connects the large calf muscles (Gastrocnemius and Soleus) to the heel bone (calcaneus). This tendon is sometimes called the heel cord. It is important for pushing-off and standing on your toes and is important for walking, running, or jumping. Tendinitis is often caused by overuse and repetitive microtrauma. SYMPTOMS  Pain, tenderness, swelling, warmth, and redness may occur over the Achilles tendon even at rest.  Pain with pushing off, or flexing or extending the ankle.  Pain that is worsened after or during activity. CAUSES   Overuse sometimes seen with rapid increase in exercise programs or in sports requiring running and jumping.  Poor physical conditioning (strength and flexibility or endurance).  Running sports, especially training running down hills.  Inadequate warm-up before practice or play or failure to stretch before participation.  Injury to the tendon. PREVENTION   Warm up and stretch before practice or  competition.  Allow time for adequate rest and recovery between practices and competition.  Keep up conditioning.  Keep up ankle and leg flexibility.  Improve or keep muscle strength and endurance.  Improve cardiovascular fitness.  Use proper technique.  Use proper equipment (shoes, skates).  To help prevent recurrence, taping, protective strapping, or an adhesive bandage may be recommended for several weeks after healing is complete. PROGNOSIS   Recovery may take weeks to several months to heal.  Longer recovery is expected if symptoms have been prolonged.  Recovery is usually quicker if the inflammation is due to a direct blow as compared with overuse or sudden strain. RELATED COMPLICATIONS   Healing time will be prolonged if the condition is not correctly treated. The injury must be given plenty of time to heal.  Symptoms can reoccur if activity is resumed too soon.  Untreated, tendinitis may increase the risk of tendon rupture requiring additional time for recovery   and possibly surgery. TREATMENT   The first treatment consists of rest anti-inflammatory medication, and ice to relieve the pain.  Stretching and strengthening exercises after resolution of pain will likely help reduce the risk of recurrence. Referral to a physical therapist or athletic trainer for further evaluation and treatment may be helpful.  A walking boot or cast may be recommended to rest the Achilles tendon. This can help break the cycle of inflammation and microtrauma.  Arch supports (orthotics) may be prescribed or recommended by your caregiver as an adjunct to therapy and rest.  Surgery to remove the inflamed tendon lining or degenerated tendon tissue is rarely necessary and has shown less than predictable results. MEDICATION   Nonsteroidal anti-inflammatory medications, such as aspirin and ibuprofen, may be used for pain and inflammation relief. Do not take within 7 days before surgery. Take  these as directed by your caregiver. Contact your caregiver immediately if any bleeding, stomach upset, or signs of allergic reaction occur. Other minor pain relievers, such as acetaminophen, may also be used.  Pain relievers may be prescribed as necessary by your caregiver. Do not take prescription pain medication for longer than 4 to 7 days. Use only as directed and only as much as you need.  Cortisone injections are rarely indicated. Cortisone injections may weaken tendons and predispose to rupture. It is better to give the condition more time to heal than to use them. HEAT AND COLD  Cold is used to relieve pain and reduce inflammation for acute and chronic Achilles tendinitis. Cold should be applied for 10 to 15 minutes every 2 to 3 hours for inflammation and pain and immediately after any activity that aggravates your symptoms. Use ice packs or an ice massage.  Heat may be used before performing stretching and strengthening activities prescribed by your caregiver. Use a heat pack or a warm soak. SEEK MEDICAL CARE IF:  Symptoms get worse or do not improve in 2 weeks despite treatment.  New, unexplained symptoms develop. Drugs used in treatment may produce side effects.  EXERCISES:  RANGE OF MOTION (ROM) AND STRETCHING EXERCISES - Achilles Tendinitis  These exercises may help you when beginning to rehabilitate your injury. Your symptoms may resolve with or without further involvement from your physician, physical therapist or athletic trainer. While completing these exercises, remember:   Restoring tissue flexibility helps normal motion to return to the joints. This allows healthier, less painful movement and activity.  An effective stretch should be held for at least 30 seconds.  A stretch should never be painful. You should only feel a gentle lengthening or release in the stretched tissue.  STRETCH  Gastroc, Standing   Place hands on wall.  Extend right / left leg, keeping the  front knee somewhat bent.  Slightly point your toes inward on your back foot.  Keeping your right / left heel on the floor and your knee straight, shift your weight toward the wall, not allowing your back to arch.  You should feel a gentle stretch in the right / left calf. Hold this position for 10 seconds. Repeat 3 times. Complete this stretch 2 times per day.  STRETCH  Soleus, Standing   Place hands on wall.  Extend right / left leg, keeping the other knee somewhat bent.  Slightly point your toes inward on your back foot.  Keep your right / left heel on the floor, bend your back knee, and slightly shift your weight over the back leg so that you feel a   gentle stretch deep in your back calf.  Hold this position for 10 seconds. Repeat 3 times. Complete this stretch 2 times per day.  STRETCH  Gastrocsoleus, Standing  Note: This exercise can place a lot of stress on your foot and ankle. Please complete this exercise only if specifically instructed by your caregiver.   Place the ball of your right / left foot on a step, keeping your other foot firmly on the same step.  Hold on to the wall or a rail for balance.  Slowly lift your other foot, allowing your body weight to press your heel down over the edge of the step.  You should feel a stretch in your right / left calf.  Hold this position for 10 seconds.  Repeat this exercise with a slight bend in your knee. Repeat 3 times. Complete this stretch 2 times per day.   STRENGTHENING EXERCISES - Achilles Tendinitis These exercises may help you when beginning to rehabilitate your injury. They may resolve your symptoms with or without further involvement from your physician, physical therapist or athletic trainer. While completing these exercises, remember:   Muscles can gain both the endurance and the strength needed for everyday activities through controlled exercises.  Complete these exercises as instructed by your physician,  physical therapist or athletic trainer. Progress the resistance and repetitions only as guided.  You may experience muscle soreness or fatigue, but the pain or discomfort you are trying to eliminate should never worsen during these exercises. If this pain does worsen, stop and make certain you are following the directions exactly. If the pain is still present after adjustments, discontinue the exercise until you can discuss the trouble with your clinician.  STRENGTH - Plantar-flexors   Sit with your right / left leg extended. Holding onto both ends of a rubber exercise band/tubing, loop it around the ball of your foot. Keep a slight tension in the band.  Slowly push your toes away from you, pointing them downward.  Hold this position for 10 seconds. Return slowly, controlling the tension in the band/tubing. Repeat 3 times. Complete this exercise 2 times per day.   STRENGTH - Plantar-flexors   Stand with your feet shoulder width apart. Steady yourself with a wall or table using as little support as needed.  Keeping your weight evenly spread over the width of your feet, rise up on your toes.*  Hold this position for 10 seconds. Repeat 3 times. Complete this exercise 2 times per day.  *If this is too easy, shift your weight toward your right / left leg until you feel challenged. Ultimately, you may be asked to do this exercise with your right / left foot only.  STRENGTH  Plantar-flexors, Eccentric  Note: This exercise can place a lot of stress on your foot and ankle. Please complete this exercise only if specifically instructed by your caregiver.   Place the balls of your feet on a step. With your hands, use only enough support from a wall or rail to keep your balance.  Keep your knees straight and rise up on your toes.  Slowly shift your weight entirely to your right / left toes and pick up your opposite foot. Gently and with controlled movement, lower your weight through your right /  left foot so that your heel drops below the level of the step. You will feel a slight stretch in the back of your calf at the end position.  Use the healthy leg to help rise up onto   the balls of both feet, then lower weight only on the right / left leg again. Build up to 15 repetitions. Then progress to 3 consecutive sets of 15 repetitions.*  After completing the above exercise, complete the same exercise with a slight knee bend (about 30 degrees). Again, build up to 15 repetitions. Then progress to 3 consecutive sets of 15 repetitions.* Perform this exercise 2 times per day.  *When you easily complete 3 sets of 15, your physician, physical therapist or athletic trainer may advise you to add resistance by wearing a backpack filled with additional weight.  STRENGTH - Plantar Flexors, Seated   Sit on a chair that allows your feet to rest flat on the ground. If necessary, sit at the edge of the chair.  Keeping your toes firmly on the ground, lift your right / left heel as far as you can without increasing any discomfort in your ankle. Repeat 3 times. Complete this exercise 2 times a day.  

## 2019-11-17 NOTE — Progress Notes (Signed)
Subjective:  Patient ID: Amy Velazquez, female    DOB: [birth year],  MRN: [id number]  Chief Complaint  Patient presents with  . Plantar Fasciitis    bilateral foot/heel pain - started about 2 years ago and has gotten worse over time    34 y.o. female presents with the above complaint. Heel pain present for ~ 2 years, worsened during last pregnancy, L posterior, R inferior, and R posterior (L post and R inf are worst  @8 /10, R post 2-3/10). No tx thus far.      Review of Systems: Negative except as noted in the HPI. Denies N/V/F/Ch.  Past Medical History:  Diagnosis Date  . Medical history non-contributory     Current Outpatient Medications:  .  ibuprofen (ADVIL,MOTRIN) 600 MG tablet, Take 1 tablet (600 mg total) by mouth every 6 (six) hours., Disp: 30 tablet, Rfl: 0 .  [START ON 11/27/2019] naproxen (NAPROSYN) 500 MG tablet, Take 1 tablet (500 mg total) by mouth 2 (two) times daily with a meal for 14 days., Disp: 28 tablet, Rfl: 0 .  [START ON 11/20/2019] predniSONE (DELTASONE) 5 MG tablet, Take 6 tablets (30 mg total) by mouth daily with breakfast for 1 day, THEN 5 tablets (25 mg total) daily with breakfast for 1 day, THEN 4 tablets (20 mg total) daily with breakfast for 1 day, THEN 3 tablets (15 mg total) daily with breakfast for 1 day, THEN 2 tablets (10 mg total) daily with breakfast for 1 day, THEN 1 tablet (5 mg total) daily with breakfast for 1 day., Disp: 21 tablet, Rfl: 0 .  Prenatal Vit-Fe Fumarate-FA (PRENATAL MULTIVITAMIN) TABS tablet, Take 1 tablet by mouth at bedtime., Disp: , Rfl:   Social History   Tobacco Use  Smoking Status Never Smoker  Smokeless Tobacco Never Used    No Known Allergies Objective:   Vitals:   11/17/19 0932  BP: (!) 147/87  Pulse: 77   There is no height or weight on file to calculate BMI. Constitutional Well developed. Well nourished.  Vascular Dorsalis pedis pulses palpable bilaterally. Posterior tibial pulses palpable  bilaterally. Capillary refill normal to all digits.  No cyanosis or clubbing noted. Pedal hair growth normal.  Neurologic Normal speech. Oriented to person, place, and time. Epicritic sensation to light touch grossly present bilaterally.  Dermatologic Nails well groomed and normal in appearance. No open wounds. No skin lesions.  Orthopedic: Normal joint ROM without pain or crepitus bilaterally. No visible deformities. Tender to palpation at the calcaneal tuber right.  Bilateral posterior tenderness at the Achilles insertion, mild on the right, and severe on left No pain with calcaneal squeeze bilaterally. Ankle ROM diminished range with pain bilaterally. Silfverskiold Test: positive bilaterally.   Radiographs: Taken and reviewed. No acute fractures or dislocations. No evidence of stress fracture.  Plantar heel spur present bilateral. Posterior heel spur present bilateral.   Assessment:   1. Plantar fasciitis, right   2. Achilles tendinosis of both lower extremities   3. Pain in both feet   4. Gastrocnemius equinus of right lower extremity   5. Gastrocnemius equinus of left lower extremity    Plan:  Patient was evaluated and treated and all questions answered.  She is pleasant healthy 34 year old female suffering from plantar fasciitis on the right heel and bilateral Achilles tendinosis.  This going on for some time.  She is uninsured and has panel pocket for her care today.  I would like to maximize her therapeutic benefit without causing undue financial hardship  on her and her family.  Our plan is as follows.  Plantar Fasciitis, right - XR reviewed as above.  - Educated on icing and stretching. Instructions given.  - Injection delivered to the plantar fascia as below. -Ankle gauntlet compression sleeve dispensed bilateral.  Gastrocnemius equinus and Achilles tendinosis, bilateral - XR reviewed as above -Educated on icing and stretching.  Instructions given -Rx for prednisone  taper to begin this coming Monday, June 28 for 6 days.  Followed by Naprosyn for 14-day course beginning July 5   Procedure: Injection Tendon/Ligament Location: Right plantar fascia at the glabrous junction; medial approach. Skin Prep: alcohol Injectate: 1 cc 0.5% marcaine plain, 1 cc dexamethasone phosphate, 0.5 cc kenalog 10. Disposition: Patient tolerated procedure well. Injection site dressed with a band-aid.  No follow-ups on file.

## 2021-01-04 ENCOUNTER — Ambulatory Visit: Payer: Self-pay

## 2021-01-04 DIAGNOSIS — Z23 Encounter for immunization: Secondary | ICD-10-CM

## 2021-01-04 NOTE — Progress Notes (Signed)
   Covid-19 Vaccination Clinic  Name:  Amy Velazquez    MRN: [id number] DOB: Dec 20, 1985  01/04/2021  Ms. Amy Velazquez was observed post Covid-19 immunization for 15 minutes without incident. She was provided with Vaccine Information Sheet and instruction to access the V-Safe system.   Ms. Amy Velazquez was instructed to call 911 with any severe reactions post vaccine: Difficulty breathing  Swelling of face and throat  A fast heartbeat  A bad rash all over body  Dizziness and weakness   Immunizations Administered     Name Date Dose VIS Date Route   PFIZER Comrnaty(Gray TOP) Covid-19 Vaccine 01/04/2021  9:19 AM 0.3 mL 05/02/2020 Intramuscular   Manufacturer: [employer], [employer]   Lot: [id number]   NDC: [phone]

## 2021-02-01 ENCOUNTER — Ambulatory Visit: Payer: Medicaid Other

## 2021-02-01 DIAGNOSIS — Z23 Encounter for immunization: Secondary | ICD-10-CM

## 2021-02-01 NOTE — Progress Notes (Signed)
   Covid-19 Vaccination Clinic  Name:  Amy Velazquez    MRN: [id number] DOB: 10/01/1985  02/01/2021  Ms. Amy Velazquez was observed post Covid-19 immunization for 15 minutes without incident. She was provided with Vaccine Information Sheet and instruction to access the V-Safe system.   Ms. Amy Velazquez was instructed to call 911 with any severe reactions post vaccine: Difficulty breathing  Swelling of face and throat  A fast heartbeat  A bad rash all over body  Dizziness and weakness

## 2021-12-21 ENCOUNTER — Telehealth: Payer: Self-pay | Admitting: Pediatrics

## 2021-12-21 NOTE — Telephone Encounter (Signed)
I contacted the patient's caregiver to inform them that they  received a dose given past it's effective date (COVID-19 vaccine) from the [facility]. I shared the following information with the patient or caregiver: vaccines given after the recommended length of time out of the freezer may be less effective but we are not aware of any other adverse effects. The patient can be re-vaccinated at no cost if the patient decides to do so. Answered patient questions/concerns. Encouraged patient to reach out if they have any additional questions or concerns.   The patient declined to be re-vaccinated at this time. The patient was advised to consider receiving the next version of the COVID Vaccine in the future.  

## 2022-04-24 DIAGNOSIS — Z419 Encounter for procedure for purposes other than remedying health state, unspecified: Secondary | ICD-10-CM | POA: Diagnosis not present

## 2022-05-25 DIAGNOSIS — Z419 Encounter for procedure for purposes other than remedying health state, unspecified: Secondary | ICD-10-CM | POA: Diagnosis not present

## 2022-06-25 DIAGNOSIS — Z419 Encounter for procedure for purposes other than remedying health state, unspecified: Secondary | ICD-10-CM | POA: Diagnosis not present

## 2022-07-24 DIAGNOSIS — Z419 Encounter for procedure for purposes other than remedying health state, unspecified: Secondary | ICD-10-CM | POA: Diagnosis not present

## 2022-08-24 DIAGNOSIS — Z419 Encounter for procedure for purposes other than remedying health state, unspecified: Secondary | ICD-10-CM | POA: Diagnosis not present

## 2022-09-16 ENCOUNTER — Telehealth: Payer: Self-pay

## 2022-09-16 NOTE — Telephone Encounter (Signed)
LVM for patient to call back to schedule apt with interpreter ID# [id number]. AS, CMA

## 2022-09-23 DIAGNOSIS — Z419 Encounter for procedure for purposes other than remedying health state, unspecified: Secondary | ICD-10-CM | POA: Diagnosis not present

## 2022-10-24 DIAGNOSIS — Z419 Encounter for procedure for purposes other than remedying health state, unspecified: Secondary | ICD-10-CM | POA: Diagnosis not present

## 2022-11-23 DIAGNOSIS — Z419 Encounter for procedure for purposes other than remedying health state, unspecified: Secondary | ICD-10-CM | POA: Diagnosis not present

## 2022-12-24 DIAGNOSIS — Z419 Encounter for procedure for purposes other than remedying health state, unspecified: Secondary | ICD-10-CM | POA: Diagnosis not present

## 2023-01-24 DIAGNOSIS — Z419 Encounter for procedure for purposes other than remedying health state, unspecified: Secondary | ICD-10-CM | POA: Diagnosis not present

## 2023-02-23 DIAGNOSIS — Z419 Encounter for procedure for purposes other than remedying health state, unspecified: Secondary | ICD-10-CM | POA: Diagnosis not present

## 2023-03-26 DIAGNOSIS — Z419 Encounter for procedure for purposes other than remedying health state, unspecified: Secondary | ICD-10-CM | POA: Diagnosis not present

## 2023-04-16 DIAGNOSIS — Z114 Encounter for screening for human immunodeficiency virus [HIV]: Secondary | ICD-10-CM | POA: Diagnosis not present

## 2023-04-16 DIAGNOSIS — Z01419 Encounter for gynecological examination (general) (routine) without abnormal findings: Secondary | ICD-10-CM | POA: Diagnosis not present

## 2023-04-16 DIAGNOSIS — Z789 Other specified health status: Secondary | ICD-10-CM | POA: Diagnosis not present

## 2023-04-16 DIAGNOSIS — Z131 Encounter for screening for diabetes mellitus: Secondary | ICD-10-CM | POA: Diagnosis not present

## 2023-04-16 DIAGNOSIS — Z30431 Encounter for routine checking of intrauterine contraceptive device: Secondary | ICD-10-CM | POA: Diagnosis not present

## 2023-04-25 DIAGNOSIS — Z419 Encounter for procedure for purposes other than remedying health state, unspecified: Secondary | ICD-10-CM | POA: Diagnosis not present

## 2023-04-27 DIAGNOSIS — A549 Gonococcal infection, unspecified: Secondary | ICD-10-CM | POA: Diagnosis not present

## 2023-05-26 DIAGNOSIS — Z419 Encounter for procedure for purposes other than remedying health state, unspecified: Secondary | ICD-10-CM | POA: Diagnosis not present

## 2023-06-26 DIAGNOSIS — Z419 Encounter for procedure for purposes other than remedying health state, unspecified: Secondary | ICD-10-CM | POA: Diagnosis not present

## 2023-07-24 DIAGNOSIS — Z419 Encounter for procedure for purposes other than remedying health state, unspecified: Secondary | ICD-10-CM | POA: Diagnosis not present

## 2023-09-04 DIAGNOSIS — Z419 Encounter for procedure for purposes other than remedying health state, unspecified: Secondary | ICD-10-CM | POA: Diagnosis not present

## 2023-10-04 DIAGNOSIS — Z419 Encounter for procedure for purposes other than remedying health state, unspecified: Secondary | ICD-10-CM | POA: Diagnosis not present

## 2023-11-04 DIAGNOSIS — Z419 Encounter for procedure for purposes other than remedying health state, unspecified: Secondary | ICD-10-CM | POA: Diagnosis not present

## 2023-12-04 DIAGNOSIS — Z419 Encounter for procedure for purposes other than remedying health state, unspecified: Secondary | ICD-10-CM | POA: Diagnosis not present

## 2024-01-04 DIAGNOSIS — Z419 Encounter for procedure for purposes other than remedying health state, unspecified: Secondary | ICD-10-CM | POA: Diagnosis not present

## 2024-02-04 DIAGNOSIS — Z419 Encounter for procedure for purposes other than remedying health state, unspecified: Secondary | ICD-10-CM | POA: Diagnosis not present
